# Patient Record
Sex: Female | Born: 1990 | Race: White | Hispanic: Yes | Marital: Single | Smoking: Never smoker
Health system: Southern US, Community
[De-identification: ages and names within clinical notes are randomized; demographics above are authoritative.]

## PROBLEM LIST (undated history)

## (undated) DIAGNOSIS — Z789 Other specified health status: Secondary | ICD-10-CM

## (undated) DIAGNOSIS — K8020 Calculus of gallbladder without cholecystitis without obstruction: Secondary | ICD-10-CM

## (undated) DIAGNOSIS — J189 Pneumonia, unspecified organism: Secondary | ICD-10-CM

## (undated) HISTORY — PX: NO PAST SURGERIES: SHX2092

---
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
## 2011-02-02 ENCOUNTER — Encounter: Payer: Self-pay | Admitting: Obstetrics and Gynecology

## 2011-02-02 ENCOUNTER — Ambulatory Visit: Payer: Self-pay | Admitting: Obstetrics and Gynecology

## 2011-02-02 MED ORDER — NORETHINDRONE 0.35 MG PO TABS: 1.0000 | ORAL_TABLET | Freq: Every day | ORAL | Status: DC

## 2011-02-02 NOTE — Patient Instructions (Signed)
Reviewed how to take OCPs, danger sign. Advised abd tightening exercises and encourage to continue breastfeeding

## 2011-02-02 NOTE — Progress Notes (Unsigned)
Kristy Martin is a 20 y.o. G1P1001 at 8 wks post NSVD female Chief Complaint  Patient presents with  . Postpartum Care  SUBJECTIVE: She has no postpartum concerns except mild diffuse abd discomfort since delivery.  She was induced at 39 weeks due to lung mass on the baby. Baby stayed in NICU for 4 days and is now doing well. Plan is to recheck at age 74-7 months and consider surgery if the mass is still present. She's breast-feeding.4 times a day and bottlefeeding about 4 ounces 36 hours. She graduated from high school and plans to stay home with the baby use taking care the baby and 2 other children during the day her mother helps with baby care.baby sleeps all-night. She denies depression. She had no perineal repair. Sexually active without problem. Walking but not doing any specific exercising. Desires OCPs .  Past Medical History  Diagnosis Date  . Asthma   . No pertinent past medical history    History   Social History  . Marital Status: Single    Spouse Name: N/A    Number of Children: N/A  . Years of Education: N/A   Social History Main Topics  . Smoking status: Never Smoker   . Smokeless tobacco: Never Used  . Alcohol Use: No  . Drug Use: No  . Sexually Active: Yes    Birth Control/ Protection: Inserts   Other Topics Concern  . None   Social History Narrative  . None   Review of Systems  Constitutional: Negative for fever and chills.  Gastrointestinal: Positive for abdominal pain. Negative for nausea and vomiting.  Genitourinary: Negative for dysuria.  Neurological: Negative for headaches.  Psychiatric/Behavioral: Negative for depression.   OBJECTIVE: Filed Vitals:   02/02/11 1524  BP: 118/67  Pulse: 87  Temp: 97.8 F (36.6 C)  Gen.: NAD Head: Normocephalic Neck: no thyromegaly Lungs: CTA bilateral Heart: R RR without murmur Abdomen soft no masses extensive stretch marks, 2 fingerbreadth diastasis Bimanual cervix firm long closed  Uterus normal  size shape and position  Adnexae:No tenderness or masses Extremities: NT calves, no edema  A/P  Normal 8 week postpartum exam. Rx Micronor. Diet and exercise reviewed.

## 2011-06-30 ENCOUNTER — Encounter (HOSPITAL_COMMUNITY): Payer: Self-pay | Admitting: Emergency Medicine

## 2011-06-30 ENCOUNTER — Emergency Department (HOSPITAL_COMMUNITY)
Admission: EM | Admit: 2011-06-30 | Discharge: 2011-06-30 | Disposition: A | Discharge status: Home / Self Care | Payer: Self-pay | Attending: Emergency Medicine | Admitting: Emergency Medicine

## 2011-06-30 DIAGNOSIS — A499 Bacterial infection, unspecified: Secondary | ICD-10-CM | POA: Insufficient documentation

## 2011-06-30 DIAGNOSIS — R42 Dizziness and giddiness: Secondary | ICD-10-CM | POA: Insufficient documentation

## 2011-06-30 DIAGNOSIS — B9689 Other specified bacterial agents as the cause of diseases classified elsewhere: Secondary | ICD-10-CM | POA: Insufficient documentation

## 2011-06-30 DIAGNOSIS — N760 Acute vaginitis: Secondary | ICD-10-CM | POA: Insufficient documentation

## 2011-06-30 DIAGNOSIS — R1032 Left lower quadrant pain: Secondary | ICD-10-CM | POA: Insufficient documentation

## 2011-06-30 DIAGNOSIS — R110 Nausea: Secondary | ICD-10-CM | POA: Insufficient documentation

## 2011-06-30 LAB — URINALYSIS, ROUTINE W REFLEX MICROSCOPIC
Glucose, UA: NEGATIVE mg/dL
Ketones, ur: NEGATIVE mg/dL
Nitrite: NEGATIVE
Protein, ur: NEGATIVE mg/dL
Urobilinogen, UA: 0.2 mg/dL (ref 0.0–1.0)

## 2011-06-30 LAB — WET PREP, GENITAL: Yeast Wet Prep HPF POC: NONE SEEN

## 2011-06-30 LAB — POCT I-STAT, CHEM 8
BUN: 8 mg/dL (ref 6–23)
Calcium, Ion: 1.3 mmol/L (ref 1.12–1.32)
TCO2: 28 mmol/L (ref 0–100)

## 2011-06-30 LAB — URINE MICROSCOPIC-ADD ON

## 2011-06-30 MED ORDER — METRONIDAZOLE 500 MG PO TABS: 500.0000 mg | ORAL_TABLET | Freq: Three times a day (TID) | ORAL | Status: AC

## 2011-06-30 MED ORDER — ONDANSETRON HCL 4 MG PO TABS: 4.0000 mg | ORAL_TABLET | Freq: Four times a day (QID) | ORAL | Status: AC

## 2011-06-30 NOTE — ED Notes (Signed)
Vital signs stable. 

## 2011-06-30 NOTE — ED Notes (Signed)
White d/c she states

## 2011-06-30 NOTE — ED Notes (Signed)
Patient here with complaints of nausea and vomiting and dizziness for two weeks, sts last menstrual cycle was 12/31 and she may be pregnant, she did take a test and was negative.

## 2011-06-30 NOTE — ED Notes (Signed)
Family at bedside. 

## 2011-06-30 NOTE — ED Notes (Signed)
N/v x 2 weeks left sided abd pain 12/31 lmp took home preg test it was neg

## 2011-06-30 NOTE — ED Notes (Signed)
Patient is resting comfortably. 

## 2011-06-30 NOTE — ED Notes (Signed)
Patient denies pain and is resting comfortably.  

## 2011-06-30 NOTE — ED Provider Notes (Signed)
History     CSN: [id number]  Arrival date & time 06/30/11  1131   First MD Initiated Contact with Patient 06/30/11 1203      Chief Complaint  Patient presents with  . Nausea    (Consider location/radiation/quality/duration/timing/severity/associated sxs/prior treatment) HPI  Patient relates for the past 2 weeks she's had nausea, states she feels dizzy, and has decreased appetite. She is noted for the last week she's had some left lower cautery pain off and on that lasted about 5 minutes. She states she feels dizzy. Patient is G1 P1 Ab0. She has her infant with her who appears to be about 51 months old. She relates her last normal period was December 31 and was little bit wider than usual. She denies using any birth control. She denies any new sexual contact.. patient states she felt similar to when she had anemia in the past.  PCP none  Past Medical History  Diagnosis Date  . Anemia   . No pertinent past medical history     Past Surgical History  Procedure Date  . No past surgeries     No family history on file.  History  Substance Use Topics  . Smoking status: Never Smoker   . Smokeless tobacco: Never Used  . Alcohol Use: No   lives with spouse  OB History    Grav Para Term Preterm Abortions TAB SAB Ect Mult Living   1 1 1  0 0 0 0 0 0 1      Review of Systems  All other systems reviewed and are negative.    Allergies  Review of patient's allergies indicates no known allergies.  Home Medications   Current Outpatient Rx  Name Route Sig Dispense Refill  . DOCUSATE SODIUM 100 MG PO CAPS Oral Take 100 mg by mouth 2 (two) times daily.      BP 114/52  Pulse 72  Temp(Src) 98.2 F (36.8 C) (Oral)  Resp 20  SpO2 98%  Breastfeeding? Yes  Vital signs normal    Physical Exam  Constitutional: She is oriented to person, place, and time. She appears well-developed and well-nourished.  Non-toxic appearance. She does not appear ill. No distress.  HENT:  Head:  Normocephalic and atraumatic.  Right Ear: External ear normal.  Left Ear: External ear normal.  Nose: Nose normal. No mucosal edema or rhinorrhea.  Mouth/Throat: Mucous membranes are normal. No dental abscesses or uvula swelling.       Mucous membranes dry  Eyes: Conjunctivae and EOM are normal. Pupils are equal, round, and reactive to light.       Sclera are not pale  Neck: Normal range of motion and full passive range of motion without pain. Neck supple.  Cardiovascular: Normal rate, regular rhythm and normal heart sounds.  Exam reveals no gallop and no friction rub.   No murmur heard. Pulmonary/Chest: Effort normal and breath sounds normal. No respiratory distress. She has no wheezes. She has no rhonchi. She has no rales. She exhibits no tenderness and no crepitus.  Abdominal: Soft. Normal appearance and bowel sounds are normal. She exhibits no distension. There is tenderness. There is no rebound and no guarding.       Patient has mild tenderness in her left lower quadrant without guarding or rebound  Genitourinary:       Patient has normal external genitalia. She is noted to have moderate amount of yellow discharge. She has minimal discomfort to palpation of her uterus which is normal size, or  her adnexa which are not enlarged. She relates to pelvic exam does not reproduce her pain.  Musculoskeletal: Normal range of motion. She exhibits no edema and no tenderness.       Moves all extremities well.   Neurological: She is alert and oriented to person, place, and time. She has normal strength. No cranial nerve deficit.  Skin: Skin is warm, dry and intact. No rash noted. No erythema. No pallor.  Psychiatric: She has a normal mood and affect. Her speech is normal and behavior is normal. Her mood appears not anxious.    ED Course  Procedures (including critical care time)  Pt refused and nausea or pain medications.   Results for orders placed during the hospital encounter of 06/30/11    URINALYSIS, ROUTINE W REFLEX MICROSCOPIC      Component Value Range   Color, Urine YELLOW  YELLOW    APPearance CLOUDY (*) CLEAR    Specific Gravity, Urine 1.021  1.005 - 1.030    pH 5.5  5.0 - 8.0    Glucose, UA NEGATIVE  NEGATIVE (mg/dL)   Hgb urine dipstick NEGATIVE  NEGATIVE    Bilirubin Urine NEGATIVE  NEGATIVE    Ketones, ur NEGATIVE  NEGATIVE (mg/dL)   Protein, ur NEGATIVE  NEGATIVE (mg/dL)   Urobilinogen, UA 0.2  0.0 - 1.0 (mg/dL)   Nitrite NEGATIVE  NEGATIVE    Leukocytes, UA MODERATE (*) NEGATIVE   POCT PREGNANCY, URINE      Component Value Range   Preg Test, Ur NEGATIVE  NEGATIVE   WET PREP, GENITAL      Component Value Range   Yeast Wet Prep HPF POC NONE SEEN  NONE SEEN    Trich, Wet Prep NONE SEEN  NONE SEEN    Clue Cells Wet Prep HPF POC MANY (*) NONE SEEN    WBC, Wet Prep HPF POC TOO NUMEROUS TO COUNT (*) NONE SEEN   URINE MICROSCOPIC-ADD ON      Component Value Range   Squamous Epithelial / LPF MANY (*) RARE    WBC, UA 3-6  <3 (WBC/hpf)   RBC / HPF 0-2  <3 (RBC/hpf)   Bacteria, UA MANY (*) RARE   POCT I-STAT, CHEM 8      Component Value Range   Sodium 142  135 - 145 (mEq/L)   Potassium 3.9  3.5 - 5.1 (mEq/L)   Chloride 102  96 - 112 (mEq/L)   BUN 8  6 - 23 (mg/dL)   Creatinine, Ser 8.29  0.50 - 1.10 (mg/dL)   Glucose, Bld 80  70 - 99 (mg/dL)   Calcium, Ion 5.62  1.30 - 1.32 (mmol/L)   TCO2 28  0 - 100 (mmol/L)   Hemoglobin 13.9  12.0 - 15.0 (g/dL)   HCT 86.5  78.4 - 69.6 (%)   Laboratory interpretation all normal except bacterial vaginosis   Diagnoses that have been ruled out:  None  Diagnoses that are still under consideration:  None  Final diagnoses:  BV (bacterial vaginosis)  Nausea   New Prescriptions   METRONIDAZOLE (FLAGYL) 500 MG TABLET    Take 1 tablet (500 mg total) by mouth 3 (three) times daily.   ONDANSETRON (ZOFRAN) 4 MG TABLET    Take 1 tablet (4 mg total) by mouth every 6 (six) hours.   Plan discharge Devoria Albe, MD,  FACEP    MDM          Ward Givens, MD 06/30/11 [phone]

## 2014-02-21 ENCOUNTER — Encounter (HOSPITAL_COMMUNITY): Payer: Self-pay | Admitting: Emergency Medicine

## 2014-02-21 ENCOUNTER — Emergency Department (HOSPITAL_COMMUNITY): Payer: Self-pay

## 2014-02-21 ENCOUNTER — Emergency Department (HOSPITAL_COMMUNITY)
Admission: EM | Admit: 2014-02-21 | Discharge: 2014-02-21 | Disposition: A | Discharge status: Home / Self Care | Payer: Self-pay | Attending: Emergency Medicine | Admitting: Emergency Medicine

## 2014-02-21 DIAGNOSIS — K8050 Calculus of bile duct without cholangitis or cholecystitis without obstruction: Secondary | ICD-10-CM | POA: Insufficient documentation

## 2014-02-21 DIAGNOSIS — K8020 Calculus of gallbladder without cholecystitis without obstruction: Secondary | ICD-10-CM | POA: Insufficient documentation

## 2014-02-21 DIAGNOSIS — Z3202 Encounter for pregnancy test, result negative: Secondary | ICD-10-CM | POA: Insufficient documentation

## 2014-02-21 DIAGNOSIS — J45909 Unspecified asthma, uncomplicated: Secondary | ICD-10-CM | POA: Insufficient documentation

## 2014-02-21 DIAGNOSIS — K8070 Calculus of gallbladder and bile duct without cholecystitis without obstruction: Secondary | ICD-10-CM

## 2014-02-21 DIAGNOSIS — R1084 Generalized abdominal pain: Secondary | ICD-10-CM | POA: Insufficient documentation

## 2014-02-21 LAB — CBC WITH DIFFERENTIAL/PLATELET
BASOS PCT: 1 % (ref 0–1)
Basophils Absolute: 0.1 10*3/uL (ref 0.0–0.1)
Eosinophils Absolute: 0.2 10*3/uL (ref 0.0–0.7)
Eosinophils Relative: 2 % (ref 0–5)
HEMATOCRIT: 39.2 % (ref 36.0–46.0)
HEMOGLOBIN: 13.2 g/dL (ref 12.0–15.0)
LYMPHS ABS: 3.8 10*3/uL (ref 0.7–4.0)
Lymphocytes Relative: 45 % (ref 12–46)
MCH: 29 pg (ref 26.0–34.0)
MCHC: 33.7 g/dL (ref 30.0–36.0)
MCV: 86.2 fL (ref 78.0–100.0)
MONO ABS: 0.6 10*3/uL (ref 0.1–1.0)
MONOS PCT: 7 % (ref 3–12)
NEUTROS ABS: 3.9 10*3/uL (ref 1.7–7.7)
NEUTROS PCT: 45 % (ref 43–77)
Platelets: 303 10*3/uL (ref 150–400)
RBC: 4.55 MIL/uL (ref 3.87–5.11)
RDW: 13.3 % (ref 11.5–15.5)
WBC: 8.5 10*3/uL (ref 4.0–10.5)

## 2014-02-21 LAB — COMPREHENSIVE METABOLIC PANEL
ALK PHOS: 104 U/L (ref 39–117)
ALT: 14 U/L (ref 0–35)
ANION GAP: 12 (ref 5–15)
AST: 20 U/L (ref 0–37)
Albumin: 3.8 g/dL (ref 3.5–5.2)
BILIRUBIN TOTAL: 0.2 mg/dL — AB (ref 0.3–1.2)
BUN: 16 mg/dL (ref 6–23)
CHLORIDE: 100 meq/L (ref 96–112)
CO2: 27 meq/L (ref 19–32)
CREATININE: 0.58 mg/dL (ref 0.50–1.10)
Calcium: 9.4 mg/dL (ref 8.4–10.5)
GLUCOSE: 88 mg/dL (ref 70–99)
POTASSIUM: 3.8 meq/L (ref 3.7–5.3)
Sodium: 139 mEq/L (ref 137–147)
Total Protein: 7.2 g/dL (ref 6.0–8.3)

## 2014-02-21 LAB — URINALYSIS, ROUTINE W REFLEX MICROSCOPIC
BILIRUBIN URINE: NEGATIVE
Glucose, UA: NEGATIVE mg/dL
HGB URINE DIPSTICK: NEGATIVE
KETONES UR: NEGATIVE mg/dL
Leukocytes, UA: NEGATIVE
NITRITE: NEGATIVE
Protein, ur: NEGATIVE mg/dL
SPECIFIC GRAVITY, URINE: 1.008 (ref 1.005–1.030)
UROBILINOGEN UA: 0.2 mg/dL (ref 0.0–1.0)
pH: 7.5 (ref 5.0–8.0)

## 2014-02-21 LAB — LIPASE, BLOOD: Lipase: 24 U/L (ref 11–59)

## 2014-02-21 LAB — POC URINE PREG, ED: PREG TEST UR: NEGATIVE

## 2014-02-21 MED ORDER — HYDROCODONE-ACETAMINOPHEN 5-325 MG PO TABS: 2.0000 | ORAL_TABLET | ORAL | Status: DC | PRN

## 2014-02-21 NOTE — ED Notes (Signed)
Pt.is aware if needing an urine specimen

## 2014-02-21 NOTE — ED Notes (Signed)
Patient arrives with complaint of abdominal pain which started last night. States that pain was initially mild, but became worse until this morning when she could not longer stand it. Points to Kristy Martin. Denies eating any suspicious foods. Endorses nausea with emesis x1 this morning. Denies urinary symptoms and fevers.

## 2014-02-21 NOTE — ED Provider Notes (Signed)
CSN: [id number]     Arrival date & time 02/21/14  8295 History   First MD Initiated Contact with Patient 02/21/14 [phone]     Chief Complaint  Patient presents with  . Abdominal Pain     (Consider location/radiation/quality/duration/timing/severity/associated sxs/prior Treatment) Patient is a 23 y.o. female presenting with abdominal pain. The history is provided by the patient. No language interpreter was used.  Abdominal Pain Pain location:  Generalized Pain quality: aching and sharp   Pain radiates to:  RUQ Pain severity:  Moderate Onset quality:  Sudden Timing:  Constant Progression:  Worsening Chronicity:  New Relieved by:  Nothing Worsened by:  Nothing tried Ineffective treatments:  None tried Associated symptoms: no dysuria and no shortness of breath   Risk factors: no alcohol abuse and has not had multiple surgeries     Past Medical History  Diagnosis Date  . Asthma   . No pertinent past medical history    Past Surgical History  Procedure Laterality Date  . No past surgeries     No family history on file. History  Substance Use Topics  . Smoking status: Never Smoker   . Smokeless tobacco: Never Used  . Alcohol Use: No   OB History   Grav Para Term Preterm Abortions TAB SAB Ect Mult Living   0 0 0 0 0 0 1     Review of Systems  Respiratory: Negative for shortness of breath.   Gastrointestinal: Positive for abdominal pain.  Genitourinary: Negative for dysuria.  All other systems reviewed and are negative.     Allergies  Review of patient's allergies indicates no known allergies.  Home Medications   Prior to Admission medications   Not on File   BP 115/54  Pulse 63  Temp(Src) 97.7 F (36.5 C) (Oral)  Resp 12  Ht  (1.575 m)  Wt 150 lb (68.04 kg)  BMI 27.43 kg/m2  SpO2 99% Physical Exam  Nursing note and vitals reviewed. Constitutional: She is oriented to person, place, and time. She appears well-developed and well-nourished.   HENT:  Head: Normocephalic and atraumatic.  Right Ear: External ear normal.  Left Ear: External ear normal.  Nose: Nose normal.  Mouth/Throat: Oropharynx is clear and moist.  Eyes: Conjunctivae and EOM are normal. Pupils are equal, round, and reactive to light.  Neck: Normal range of motion.  Cardiovascular: Normal rate and normal heart sounds.   Pulmonary/Chest: Effort normal.  Abdominal: Soft. She exhibits no distension. There is tenderness.  Musculoskeletal: Normal range of motion.  Neurological: She is alert and oriented to person, place, and time.  Skin: Skin is warm.  Psychiatric: She has a normal mood and affect.    ED Course  Procedures (including critical care time) Labs Review Labs Reviewed  COMPREHENSIVE METABOLIC PANEL - Abnormal; Notable for the following:    Total Bilirubin 0.2 (*)    All other components within normal limits  CBC WITH DIFFERENTIAL  LIPASE, BLOOD  URINALYSIS, ROUTINE W REFLEX MICROSCOPIC  POC URINE PREG, ED    Imaging Review No results found.   EKG Interpretation None      MDM   Final diagnoses:  Calculus of gallbladder and bile duct without cholecystitis or obstruction   [facility] [place] [facility] referral  Hydrocodone    Elson Areas, PA-C 02/21/14 1119

## 2014-02-21 NOTE — ED Notes (Signed)
Urine sepcimen obtained, clear and yellow.

## 2014-02-21 NOTE — Discharge Instructions (Signed)
Biliary Colic  °Biliary colic is a steady or irregular pain in the upper abdomen. It is usually under the right side of the rib cage. It happens when gallstones interfere with the normal flow of bile from the gallbladder. Bile is a liquid that helps to digest fats. Bile is made in the liver and stored in the gallbladder. When you eat a meal, bile passes from the gallbladder through the cystic duct and the common bile duct into the small intestine. There, it mixes with partially digested food. If a gallstone blocks either of these ducts, the normal flow of bile is blocked. The muscle cells in the bile duct contract forcefully to try to move the stone. This causes the pain of biliary colic.  °SYMPTOMS  °· A person with biliary colic usually complains of pain in the upper abdomen. This pain can be: °¨ In the center of the upper abdomen just below the breastbone. °¨ In the upper-right part of the abdomen, near the gallbladder and liver. °¨ Spread back toward the right shoulder blade. °· Nausea and vomiting. °· The pain usually occurs after eating. °· Biliary colic is usually triggered by the digestive system's demand for bile. The demand for bile is high after fatty meals. Symptoms can also occur when a person who has been fasting suddenly eats a very large meal. Most episodes of biliary colic pass after 1 to 5 hours. After the most intense pain passes, your abdomen may continue to ache mildly for about 24 hours. °DIAGNOSIS  °After you describe your symptoms, your caregiver will perform a physical exam. He or she will pay attention to the upper right portion of your belly (abdomen). This is the area of your liver and gallbladder. An ultrasound will help your caregiver look for gallstones. Specialized scans of the gallbladder may also be done. Blood tests may be done, especially if you have fever or if your pain persists. °PREVENTION  °Biliary colic can be prevented by controlling the risk factors for gallstones. Some of  these risk factors, such as heredity, increasing age, and pregnancy are a normal part of life. Obesity and a high-fat diet are risk factors you can change through a healthy lifestyle. Women going through menopause who take hormone replacement therapy (estrogen) are also more likely to develop biliary colic. °TREATMENT  °· Pain medication may be prescribed. °· You may be encouraged to eat a fat-free diet. °· If the first episode of biliary colic is severe, or episodes of colic keep retuning, surgery to remove the gallbladder (cholecystectomy) is usually recommended. This procedure can be done through small incisions using an instrument called a laparoscope. The procedure often requires a brief stay in the hospital. Some people can leave the hospital the same day. It is the most widely used treatment in people troubled by painful gallstones. It is effective and safe, with no complications in more than 90% of cases. °· If surgery cannot be done, medication that dissolves gallstones may be used. This medication is expensive and can take months or years to work. Only small stones will dissolve. °· Rarely, medication to dissolve gallstones is combined with a procedure called shock-wave lithotripsy. This procedure uses carefully aimed shock waves to break up gallstones. In many people treated with this procedure, gallstones form again within a few years. °PROGNOSIS  °If gallstones block your cystic duct or common bile duct, you are at risk for repeated episodes of biliary colic. There is also a 25% chance that you will develop   a gallbladder infection(acute cholecystitis), or some other complication of gallstones within 10 to 20 years. If you have surgery, schedule it at a time that is convenient for you and at a time when you are not sick. °HOME CARE INSTRUCTIONS  °· Drink plenty of clear fluids. °· Avoid fatty, greasy or fried foods, or any foods that make your pain worse. °· Take medications as directed. °SEEK MEDICAL  CARE IF:  °· You develop a fever over 100.5° F (38.1° C). °· Your pain gets worse over time. °· You develop nausea that prevents you from eating and drinking. °· You develop vomiting. °SEEK IMMEDIATE MEDICAL CARE IF:  °· You have continuous or severe belly (abdominal) pain which is not relieved with medications. °· You develop nausea and vomiting which is not relieved with medications. °· You have symptoms of biliary colic and you suddenly develop a fever and shaking chills. This may signal cholecystitis. Call your caregiver immediately. °· You develop a yellow color to your skin or the white part of your eyes (jaundice). °Document Released: 10/16/2005 Document Revised: 08/07/2011 Document Reviewed: 12/26/2007 °ExitCare® Patient Information ©2015 [employer]. This information is not intended to replace advice given to you by your health care provider. Make sure you discuss any questions you have with your health care provider. ° °

## 2014-02-21 NOTE — ED Notes (Signed)
Patient declines wheelchair at discharge.  Patient escorted to lobby by RN.  

## 2014-02-21 NOTE — ED Provider Notes (Signed)
Medical screening examination/treatment/procedure(s) were performed by non-physician practitioner and as supervising physician I was immediately available for consultation/collaboration.   EKG Interpretation None        Lakysha Kossman H Lisette Mancebo, MD 02/21/14 1126 

## 2014-03-30 ENCOUNTER — Encounter (HOSPITAL_COMMUNITY): Payer: Self-pay | Admitting: Emergency Medicine

## 2014-05-18 ENCOUNTER — Emergency Department (HOSPITAL_COMMUNITY): Payer: Self-pay

## 2014-05-18 ENCOUNTER — Encounter (HOSPITAL_COMMUNITY): Payer: Self-pay | Admitting: *Deleted

## 2014-05-18 ENCOUNTER — Emergency Department (HOSPITAL_COMMUNITY)
Admission: EM | Admit: 2014-05-18 | Discharge: 2014-05-18 | Disposition: A | Discharge status: Home / Self Care | Payer: Self-pay | Attending: Emergency Medicine | Admitting: Emergency Medicine

## 2014-05-18 DIAGNOSIS — J45909 Unspecified asthma, uncomplicated: Secondary | ICD-10-CM | POA: Insufficient documentation

## 2014-05-18 DIAGNOSIS — K8020 Calculus of gallbladder without cholecystitis without obstruction: Secondary | ICD-10-CM | POA: Insufficient documentation

## 2014-05-18 DIAGNOSIS — R1011 Right upper quadrant pain: Secondary | ICD-10-CM

## 2014-05-18 LAB — URINALYSIS, ROUTINE W REFLEX MICROSCOPIC
Bilirubin Urine: NEGATIVE
Glucose, UA: NEGATIVE mg/dL
Hgb urine dipstick: NEGATIVE
KETONES UR: NEGATIVE mg/dL
LEUKOCYTES UA: NEGATIVE
NITRITE: NEGATIVE
PROTEIN: NEGATIVE mg/dL
Specific Gravity, Urine: 1.009 (ref 1.005–1.030)
UROBILINOGEN UA: 0.2 mg/dL (ref 0.0–1.0)
pH: 6.5 (ref 5.0–8.0)

## 2014-05-18 LAB — COMPREHENSIVE METABOLIC PANEL
ALT: 16 U/L (ref 0–35)
AST: 21 U/L (ref 0–37)
Albumin: 3.9 g/dL (ref 3.5–5.2)
Alkaline Phosphatase: 105 U/L (ref 39–117)
Anion gap: 11 (ref 5–15)
BUN: 10 mg/dL (ref 6–23)
CALCIUM: 9.7 mg/dL (ref 8.4–10.5)
CO2: 25 meq/L (ref 19–32)
CREATININE: 0.56 mg/dL (ref 0.50–1.10)
Chloride: 102 mEq/L (ref 96–112)
GLUCOSE: 92 mg/dL (ref 70–99)
Potassium: 4.5 mEq/L (ref 3.7–5.3)
Sodium: 138 mEq/L (ref 137–147)
TOTAL PROTEIN: 7.5 g/dL (ref 6.0–8.3)
Total Bilirubin: <0.2 mg/dL — ABNORMAL LOW (ref 0.3–1.2)

## 2014-05-18 LAB — I-STAT BETA HCG BLOOD, ED (MC, WL, AP ONLY): I-stat hCG, quantitative: <5 m[IU]/mL (ref <5)

## 2014-05-18 LAB — CBC WITH DIFFERENTIAL/PLATELET
BASOS ABS: 0 10*3/uL (ref 0.0–0.1)
Basophils Relative: 0 % (ref 0–1)
EOS ABS: 0.2 10*3/uL (ref 0.0–0.7)
EOS PCT: 2 % (ref 0–5)
HEMATOCRIT: 40.4 % (ref 36.0–46.0)
Hemoglobin: 13.4 g/dL (ref 12.0–15.0)
LYMPHS ABS: 4.1 10*3/uL — AB (ref 0.7–4.0)
LYMPHS PCT: 43 % (ref 12–46)
MCH: 29.2 pg (ref 26.0–34.0)
MCHC: 33.2 g/dL (ref 30.0–36.0)
MCV: 88 fL (ref 78.0–100.0)
MONO ABS: 0.7 10*3/uL (ref 0.1–1.0)
Monocytes Relative: 7 % (ref 3–12)
Neutro Abs: 4.6 10*3/uL (ref 1.7–7.7)
Neutrophils Relative %: 48 % (ref 43–77)
PLATELETS: 320 10*3/uL (ref 150–400)
RBC: 4.59 MIL/uL (ref 3.87–5.11)
RDW: 13.3 % (ref 11.5–15.5)
WBC: 9.5 10*3/uL (ref 4.0–10.5)

## 2014-05-18 LAB — LIPASE, BLOOD: LIPASE: 23 U/L (ref 11–59)

## 2014-05-18 MED ORDER — ONDANSETRON HCL 4 MG/2ML IJ SOLN
4.0000 mg | Freq: Once | INTRAMUSCULAR | Status: AC
  Administered 2014-05-18: 4 mg via INTRAVENOUS
  Filled 2014-05-18: qty 2

## 2014-05-18 MED ORDER — SODIUM CHLORIDE 0.9 % IV BOLUS (SEPSIS)
1000.0000 mL | Freq: Once | INTRAVENOUS | Status: AC
Start: 2014-05-18 — End: 2014-05-18
  Administered 2014-05-18: 1000 mL via INTRAVENOUS

## 2014-05-18 MED ORDER — MORPHINE SULFATE 4 MG/ML IJ SOLN
4.0000 mg | Freq: Once | INTRAMUSCULAR | Status: AC
  Administered 2014-05-18: 4 mg via INTRAVENOUS
  Filled 2014-05-18: qty 1

## 2014-05-18 NOTE — Discharge Instructions (Signed)
Cholelithiasis  Ms. Kristy Martin, you were seen today for abdominal pain. Ultrasound shows a gallstone, do not eat any greasy foods as this will make her symptoms worse. Follow-up with a primary care physician within 3 days for continued treatment. If symptoms worsen come back to emergency department for repeat evaluation. Thank you. Cholelithiasis (also called gallstones) is a form of gallbladder disease. The gallbladder is a small organ that helps you digest fats. Symptoms of gallstones are:  Feeling sick to your stomach (nausea).  Throwing up (vomiting).  Belly pain.  Yellowing of the skin (jaundice).  Sudden pain. You may feel the pain for minutes to hours.  Fever.  Pain to the touch. HOME CARE  Only take medicines as told by your doctor.  Eat a low-fat diet until you see your doctor again. Eating fat can result in pain.  Follow up with your doctor as told. Attacks usually happen time after time. Surgery is usually needed for permanent treatment. GET HELP RIGHT AWAY IF:   Your pain gets worse.  Your pain is not helped by medicines.  You have a fever and lasting symptoms for more than 2-3 days.  You have a fever and your symptoms suddenly get worse.  You keep feeling sick to your stomach and throwing up. MAKE SURE YOU:   Understand these instructions.  Will watch your condition.  Will get help right away if you are not doing well or get worse. Document Released: 11/01/2007 Document Revised: 01/15/2013 Document Reviewed: 11/06/2012 [facility]Exit[facility] Patient Information 2015 [place], [place]. This information is not intended to replace advice given to you by your health care provider. Make sure you discuss any questions you have with your health care provider.

## 2014-05-18 NOTE — ED Notes (Signed)
thge pt ia c/o rt abd pain for 2-3 days.  She repiorts that she has gallstones.  lmp 2 months ago.  She has irregular periods

## 2014-05-18 NOTE — ED Provider Notes (Signed)
CSN: [id number]     Arrival date & time 05/18/14  0100 History  This chart was scribed for Kristy Martin Nickisha Hum, MD by Karle PlumberJennifer Tensley, ED Scribe. This patient was seen in room A07C/A07C and the patient's care was started at 1:36 AM.  Chief Complaint  Patient presents with  . Abdominal Pain   The history is provided by the patient. No language interpreter was used.    HPI Comments:  Link SnufferBarbara F Garcia Martin is a 23 y.o. female with PMH of gall stones (diagnosed approximately 2 months ago) who presents to the Emergency Department complaining of cramping right-sided abdominal pain that radiates around her abdomen that began two days ago. She reports associated nausea. Pt states she has taken Aleve (last dose 2 hours ago) with no significant relief of the pain. Patient states her symptoms are worse when she eats greasy foods, and her pain is consistent with her history of cholelithiasis. Denies vomiting, diarrhea, fever, chills or lower extremity pain. PMH of asthma.  Past Medical History  Diagnosis Date  . Asthma   . No pertinent past medical history    Past Surgical History  Procedure Laterality Date  . No past surgeries     No family history on file. History  Substance Use Topics  . Smoking status: Never Smoker   . Smokeless tobacco: Never Used  . Alcohol Use: No   OB History    Gravida Para Term Preterm AB TAB SAB Ectopic Multiple Living   1 1 1  0 0 0 0 0 0 1     Review of Systems  Constitutional: Negative for fever and chills.  Gastrointestinal: Positive for nausea and abdominal pain. Negative for vomiting and diarrhea.  All other systems reviewed and are negative.   Allergies  Review of patient's allergies indicates no known allergies.  Home Medications   Prior to Admission medications   Medication Sig Start Date End Date Taking? Authorizing Provider  HYDROcodone-acetaminophen (NORCO/VICODIN) 5-325 MG per tablet Take 2 tablets by mouth every 4 (four) hours as needed for  moderate pain or severe pain. 02/21/14  Yes Elson AreasLeslie K Sofia, PA-C   Triage Vitals: BP 115/62 mmHg  Pulse 60  Temp(Src) 97.3 F (36.3 C) (Oral)  Resp 16  SpO2 99%  LMP 03/18/2014 Physical Exam  Constitutional: She is oriented to person, place, and time. She appears well-developed and well-nourished. No distress.  HENT:  Head: Normocephalic and atraumatic.  Eyes: Conjunctivae and EOM are normal. Pupils are equal, round, and reactive to light. No scleral icterus.  Neck: Normal range of motion. Neck supple. No JVD present. No tracheal deviation present. No thyromegaly present.  Cardiovascular: Normal rate, regular rhythm and normal heart sounds.  Exam reveals no gallop and no friction rub.   No murmur heard. Pulmonary/Chest: Effort normal and breath sounds normal. No respiratory distress. She has no wheezes. She exhibits no tenderness.  Abdominal: Soft. Bowel sounds are normal. She exhibits no distension and no mass. There is tenderness (periumbilical and suprapubic). There is no rebound and no guarding.  Negative Murphy's sign  Musculoskeletal: Normal range of motion. She exhibits no edema or tenderness.  Lymphadenopathy:    She has no cervical adenopathy.  Neurological: She is alert and oriented to person, place, and time. No cranial nerve deficit. She exhibits normal muscle tone.  Skin: Skin is warm and dry. No rash noted. No erythema. No pallor.  Nursing note and vitals reviewed.   ED Course  Procedures (including critical care time) DIAGNOSTIC STUDIES:  Oxygen Saturation is 99% on RA, normal by my interpretation.   COORDINATION OF CARE: 1:40 AM- Will order pain and nausea medication, lab work and ultrasound abdomen. Pt verbalizes understanding and agrees to plan.  Medications  sodium chloride 0.9 % bolus 1,000 mL (1,000 mLs Intravenous New Bag/Given 05/18/14 0147)  morphine 4 MG/ML injection 4 mg (4 mg Intravenous Given 05/18/14 0153)  ondansetron (ZOFRAN) injection 4 mg (4 mg  Intravenous Given 05/18/14 0151)    Labs Review Labs Reviewed  CBC WITH DIFFERENTIAL - Abnormal; Notable for the following:    Lymphs Abs 4.1 (*)    All other components within normal limits  COMPREHENSIVE METABOLIC PANEL - Abnormal; Notable for the following:    Total Bilirubin <0.2 (*)    All other components within normal limits  URINE CULTURE  LIPASE, BLOOD  URINALYSIS, ROUTINE W REFLEX MICROSCOPIC    Imaging Review [place] Abdomen Complete  05/18/2014   CLINICAL DATA:  Acute onset of right upper quadrant abdominal pain. Initial encounter.  EXAM: ULTRASOUND ABDOMEN COMPLETE  COMPARISON:  None.  FINDINGS: Gallbladder: A 1.0 cm stone is seen lodged at the neck of the gallbladder. No gallbladder wall thickening or pericholecystic fluid is seen. No ultrasonographic Murphy's sign is elicited.  Common bile duct: Diameter: 0.5 cm, within normal limits in caliber.  Liver: No focal lesion identified. Within normal limits in parenchymal echogenicity.  IVC: No abnormality visualized.  Pancreas: Not visualized due to overlying bowel gas.  Spleen: Size and appearance within normal limits.  Right Kidney: Length: 9.3 cm. Echogenicity within normal limits. No mass or hydronephrosis visualized.  Left Kidney: Length: 10.5 cm. Echogenicity within normal limits. No mass or hydronephrosis visualized.  Abdominal aorta: No aneurysm visualized.  Other findings: None.  IMPRESSION: Single stone seen lodged at the neck of the gallbladder. Gallbladder otherwise unremarkable in appearance. No definite evidence for obstruction or cholecystitis. No ultrasonographic Murphy's sign elicited.   Electronically Signed   By: Roanna RaiderJeffery  Chang M.D.   On: 05/18/2014 02:49     EKG Interpretation None      MDM   Final diagnoses:  RUQ pain    Patient since emergency department out of concern for abdominal pain. It is in the right upper quadrant she states is consistent with her cholelithiasis. Ultrasound shows a gallstone in the  neck of the gallbladder, no cholecystitis associated. Her pain is controlled after morphine and Zofran. Patient was educated again on diet control, she will be advised to follow-up with a primary care physician, her vital signs remain within her normal limits and she is safe for discharge.  I personally performed the services described in this documentation, which was scribed in my presence. The recorded information has been reviewed and is accurate.    Kristy Martin Ariyon Gerstenberger, MD 05/18/14 [phone]

## 2014-05-19 LAB — URINE CULTURE
Colony Count: NO GROWTH
Culture: NO GROWTH

## 2014-06-15 ENCOUNTER — Encounter (HOSPITAL_COMMUNITY): Payer: Self-pay | Admitting: Emergency Medicine

## 2014-06-15 ENCOUNTER — Emergency Department (HOSPITAL_COMMUNITY)
Admission: EM | Admit: 2014-06-15 | Discharge: 2014-06-15 | Disposition: A | Discharge status: Home / Self Care | Payer: Self-pay | Attending: Emergency Medicine | Admitting: Emergency Medicine

## 2014-06-15 DIAGNOSIS — N72 Inflammatory disease of cervix uteri: Secondary | ICD-10-CM | POA: Insufficient documentation

## 2014-06-15 DIAGNOSIS — Z3202 Encounter for pregnancy test, result negative: Secondary | ICD-10-CM | POA: Insufficient documentation

## 2014-06-15 DIAGNOSIS — J45909 Unspecified asthma, uncomplicated: Secondary | ICD-10-CM | POA: Insufficient documentation

## 2014-06-15 DIAGNOSIS — Z8719 Personal history of other diseases of the digestive system: Secondary | ICD-10-CM | POA: Insufficient documentation

## 2014-06-15 HISTORY — DX: Calculus of gallbladder without cholecystitis without obstruction: K80.20

## 2014-06-15 LAB — COMPREHENSIVE METABOLIC PANEL
ALT: 18 U/L (ref 0–35)
AST: 23 U/L (ref 0–37)
Albumin: 3.6 g/dL (ref 3.5–5.2)
Alkaline Phosphatase: 94 U/L (ref 39–117)
Anion gap: 10 (ref 5–15)
BUN: 9 mg/dL (ref 6–23)
CALCIUM: 9.2 mg/dL (ref 8.4–10.5)
CHLORIDE: 104 meq/L (ref 96–112)
CO2: 22 mmol/L (ref 19–32)
Creatinine, Ser: 0.64 mg/dL (ref 0.50–1.10)
GFR calc Af Amer: >90 mL/min (ref >90)
GFR calc non Af Amer: >90 mL/min (ref >90)
GLUCOSE: 90 mg/dL (ref 70–99)
POTASSIUM: 3.9 mmol/L (ref 3.5–5.1)
SODIUM: 136 mmol/L (ref 135–145)
Total Bilirubin: 0.3 mg/dL (ref 0.3–1.2)
Total Protein: 7 g/dL (ref 6.0–8.3)

## 2014-06-15 LAB — URINALYSIS, ROUTINE W REFLEX MICROSCOPIC
Bilirubin Urine: NEGATIVE
Glucose, UA: NEGATIVE mg/dL
Hgb urine dipstick: NEGATIVE
KETONES UR: NEGATIVE mg/dL
LEUKOCYTES UA: NEGATIVE
Nitrite: NEGATIVE
PROTEIN: NEGATIVE mg/dL
SPECIFIC GRAVITY, URINE: 1.023 (ref 1.005–1.030)
Urobilinogen, UA: 1 mg/dL (ref 0.0–1.0)
pH: 6.5 (ref 5.0–8.0)

## 2014-06-15 LAB — CBC WITH DIFFERENTIAL/PLATELET
Basophils Absolute: 0 10*3/uL (ref 0.0–0.1)
Basophils Relative: 0 % (ref 0–1)
EOS ABS: 0.1 10*3/uL (ref 0.0–0.7)
Eosinophils Relative: 1 % (ref 0–5)
HCT: 39.7 % (ref 36.0–46.0)
Hemoglobin: 13.2 g/dL (ref 12.0–15.0)
LYMPHS ABS: 4.3 10*3/uL — AB (ref 0.7–4.0)
Lymphocytes Relative: 42 % (ref 12–46)
MCH: 29.3 pg (ref 26.0–34.0)
MCHC: 33.2 g/dL (ref 30.0–36.0)
MCV: 88.2 fL (ref 78.0–100.0)
MONO ABS: 0.7 10*3/uL (ref 0.1–1.0)
Monocytes Relative: 7 % (ref 3–12)
Neutro Abs: 4.9 10*3/uL (ref 1.7–7.7)
Neutrophils Relative %: 50 % (ref 43–77)
Platelets: 338 10*3/uL (ref 150–400)
RBC: 4.5 MIL/uL (ref 3.87–5.11)
RDW: 13.5 % (ref 11.5–15.5)
WBC: 10.1 10*3/uL (ref 4.0–10.5)

## 2014-06-15 LAB — GC/CHLAMYDIA PROBE AMP (~~LOC~~) NOT AT ARMC
CHLAMYDIA, DNA PROBE: NEGATIVE
Neisseria Gonorrhea: NEGATIVE

## 2014-06-15 LAB — WET PREP, GENITAL
Clue Cells Wet Prep HPF POC: NONE SEEN
TRICH WET PREP: NONE SEEN
Yeast Wet Prep HPF POC: NONE SEEN

## 2014-06-15 LAB — POC URINE PREG, ED: Preg Test, Ur: NEGATIVE

## 2014-06-15 LAB — LIPASE, BLOOD: Lipase: 26 U/L (ref 11–59)

## 2014-06-15 MED ORDER — ONDANSETRON HCL 4 MG/2ML IJ SOLN
4.0000 mg | Freq: Once | INTRAMUSCULAR | Status: AC
  Administered 2014-06-15: 4 mg via INTRAVENOUS
  Filled 2014-06-15: qty 2

## 2014-06-15 MED ORDER — CEFTRIAXONE SODIUM 250 MG IJ SOLR
250.0000 mg | Freq: Once | INTRAMUSCULAR | Status: AC
  Administered 2014-06-15: 250 mg via INTRAMUSCULAR
  Filled 2014-06-15: qty 250

## 2014-06-15 MED ORDER — LIDOCAINE HCL (PF) 1 % IJ SOLN
5.0000 mL | Freq: Once | INTRAMUSCULAR | Status: AC
  Administered 2014-06-15: 5 mL
  Filled 2014-06-15: qty 5

## 2014-06-15 MED ORDER — MORPHINE SULFATE 4 MG/ML IJ SOLN
6.0000 mg | Freq: Once | INTRAMUSCULAR | Status: AC
  Administered 2014-06-15: 2.2 mg via INTRAVENOUS
  Filled 2014-06-15: qty 2

## 2014-06-15 MED ORDER — AZITHROMYCIN 250 MG PO TABS
500.0000 mg | ORAL_TABLET | Freq: Once | ORAL | Status: AC
  Administered 2014-06-15: 500 mg via ORAL
  Filled 2014-06-15: qty 2

## 2014-06-15 MED ORDER — LIDOCAINE HCL (PF) 1 % IJ SOLN: 1.0000 mL | Freq: Once | INTRAMUSCULAR | Status: DC

## 2014-06-15 MED ORDER — SODIUM CHLORIDE 0.9 % IV BOLUS (SEPSIS)
1000.0000 mL | Freq: Once | INTRAVENOUS | Status: AC
  Administered 2014-06-15: 1000 mL via INTRAVENOUS

## 2014-06-15 NOTE — Discharge Instructions (Signed)
Cervicitis (Cervicitis) You were treated for a cervical infection.  Your sexual partners need to be tested for infection as well.  Follow up with a primary care physician within 3 days for continued management. If symptoms worsen come back to emergency department immediately. Thank you. Es la irritacin y dolor (inflamacin) en el cuello del tero. CUIDADEngineer, miningS EN EL HOGAR  No tenga sexo (relaciones sexuales) hasta que el mdico la autorice.  No tenga relaciones sexuales hasta que su pareja reciba tratamiento si el mdico lo indica.  Debe tomar los medicamentos antibiticos tal como se le indic. Tmelos todos, aunque se sienta mejor. SOLICITE AYUDA SI:   Los sntomas vuelven a aparecer.  Tiene fiebre. ASEGRESE DE QUE:   Comprende estas instrucciones.  Controlar su afeccin.  Recibir ayuda de inmediato si no mejora o si empeora. Document Released: 08/11/2008 Document Revised: 01/15/2013 [facility] Patient Information 2015 [place]. This information is not intended to replace advice given to you by your health care provider. Make sure you discuss any questions you have with your health care provider.

## 2014-06-15 NOTE — ED Provider Notes (Signed)
CSN: [id number]     Arrival date & time 06/15/14  0448 History   First MD Initiated Contact with Patient 06/15/14 0501     Chief Complaint  Patient presents with  . Abdominal Pain     (Consider location/radiation/quality/duration/timing/severity/associated sxs/prior Treatment) HPI  Kristy Martin is a 24 y.o. female with past medical history of cholelithiasis presenting today with abdominal pain. Patient states this began yesterday and has persisted until today with nausea or vomiting. It is a cramping pain in the right upper quadrant and radiates to her mid epigastrium. She denies eating greasy or fatty meals. Nothing has made her symptoms better or worse. Patient also admits to irregular menstrual cycle, she has not had a menstrual cycle in 2-3 months. She admits to recent unprotected sex. She's had no vaginal discharge. She denies any fevers or recent infections. Patient has no further complaints, she is accompanied by her mother.  10 Systems reviewed and are negative for acute change except as noted in the HPI.      Past Medical History  Diagnosis Date  . Asthma   . No pertinent past medical history   . Gallstones    Past Surgical History  Procedure Laterality Date  . No past surgeries     History reviewed. No pertinent family history. History  Substance Use Topics  . Smoking status: Never Smoker   . Smokeless tobacco: Never Used  . Alcohol Use: No   OB History    Gravida Para Term Preterm AB TAB SAB Ectopic Multiple Living   0 0 0 0 0 0 1     Review of Systems    Allergies  Review of patient's allergies indicates no known allergies.  Home Medications   Prior to Admission medications   Medication Sig Start Date End Date Taking? Authorizing Provider  HYDROcodone-acetaminophen (NORCO/VICODIN) 5-325 MG per tablet Take 2 tablets by mouth every 4 (four) hours as needed for moderate pain or severe pain. 02/21/14   Elson Areas, PA-C   BP 127/64 mmHg   Pulse 53  Temp(Src) 97.9 F (36.6 C) (Oral)  Resp 26  Ht  (1.549 m)  Wt 150 lb (68.04 kg)  BMI 28.36 kg/m2  SpO2 98%  LMP 04/13/2014 (Within Weeks) Physical Exam  Constitutional: She is oriented to person, place, and time. She appears well-developed and well-nourished. No distress.  HENT:  Head: Normocephalic and atraumatic.  Nose: Nose normal.  Mouth/Throat: Oropharynx is clear and moist. No oropharyngeal exudate.  Eyes: Conjunctivae and EOM are normal. Pupils are equal, round, and reactive to light. No scleral icterus.  Neck: Normal range of motion. Neck supple. No JVD present. No tracheal deviation present. No thyromegaly present.  Cardiovascular: Normal rate, regular rhythm and normal heart sounds.  Exam reveals no gallop and no friction rub.   No murmur heard. Pulmonary/Chest: Effort normal and breath sounds normal. No respiratory distress. She has no wheezes. She exhibits no tenderness.  Abdominal: Soft. Bowel sounds are normal. She exhibits no distension and no mass. There is tenderness. There is no rebound and no guarding.  Suprapubic tenderness to palpation. Negative Murphy sign, no tenderness in the right upper quadrant.  Genitourinary: Vaginal discharge found.  Moderate amounts of discharge seen in the vaginal vault. No CMT no adnexal tenderness.  Musculoskeletal: Normal range of motion. She exhibits no edema or tenderness.  Lymphadenopathy:    She has no cervical adenopathy.  Neurological: She is alert and oriented to person,  place, and time. No cranial nerve deficit. She exhibits normal muscle tone.  Skin: Skin is warm and dry. No rash noted. She is not diaphoretic. No erythema. No pallor.  Nursing note and vitals reviewed.   ED Course  Procedures (including critical care time) Labs Review Labs Reviewed  WET PREP, GENITAL - Abnormal; Notable for the following:    WBC, Wet Prep HPF POC MANY (*)    All other components within normal limits  CBC WITH  DIFFERENTIAL - Abnormal; Notable for the following:    Lymphs Abs 4.3 (*)    All other components within normal limits  URINALYSIS, ROUTINE W REFLEX MICROSCOPIC - Abnormal; Notable for the following:    APPearance CLOUDY (*)    All other components within normal limits  COMPREHENSIVE METABOLIC PANEL  LIPASE, BLOOD  POC URINE PREG, ED  GC/CHLAMYDIA PROBE AMP ([place])    Imaging Review No results found.   EKG Interpretation None      MDM   Final diagnoses:  None    Patient presents emergency department out of concern for abdominal pain. Patient states this pain is different because it is also in her midepigastrium. Common issue with her history of recent unprotected sex and irregular menstrual cycle, will obtain pregnancy test and pelvic exam. She was given morphine Zofran and fluids in the emergency department.  Patient symptoms have resolved with treatment. Pelvic exam reveals a cervicitis, thus consistent with her history of unprotected sex. She was treated with ceftriaxone azithromycin emergency department. She is advised that her partners tested as well. Laboratory studies are stable, right upper quadrant is nontender, there is no need for repeat ultrasound at this time. Her vital signs remain within her normal limits and she is safe for discharge.    Tomasita CrumbleAdeleke Daila Elbert, MD 06/15/14 [phone]

## 2014-06-15 NOTE — ED Notes (Signed)
Pt states that she started having right upper quadrant abdominal pain and vomiting since yesterday afternoon. Pt with hx of gallstones.

## 2014-10-14 ENCOUNTER — Emergency Department (HOSPITAL_COMMUNITY): Payer: Self-pay

## 2014-10-14 ENCOUNTER — Emergency Department (HOSPITAL_COMMUNITY)
Admission: EM | Admit: 2014-10-14 | Discharge: 2014-10-14 | Disposition: A | Discharge status: Home / Self Care | Payer: Self-pay | Attending: Emergency Medicine | Admitting: Emergency Medicine

## 2014-10-14 ENCOUNTER — Encounter (HOSPITAL_COMMUNITY): Payer: Self-pay | Admitting: Family Medicine

## 2014-10-14 DIAGNOSIS — Z3202 Encounter for pregnancy test, result negative: Secondary | ICD-10-CM | POA: Insufficient documentation

## 2014-10-14 DIAGNOSIS — J45909 Unspecified asthma, uncomplicated: Secondary | ICD-10-CM | POA: Insufficient documentation

## 2014-10-14 DIAGNOSIS — R1013 Epigastric pain: Secondary | ICD-10-CM

## 2014-10-14 DIAGNOSIS — R1011 Right upper quadrant pain: Secondary | ICD-10-CM

## 2014-10-14 DIAGNOSIS — K8050 Calculus of bile duct without cholangitis or cholecystitis without obstruction: Secondary | ICD-10-CM | POA: Insufficient documentation

## 2014-10-14 LAB — URINALYSIS, ROUTINE W REFLEX MICROSCOPIC
BILIRUBIN URINE: NEGATIVE
Glucose, UA: NEGATIVE mg/dL
Hgb urine dipstick: NEGATIVE
Ketones, ur: NEGATIVE mg/dL
Leukocytes, UA: NEGATIVE
Nitrite: NEGATIVE
PH: 8 (ref 5.0–8.0)
Protein, ur: NEGATIVE mg/dL
SPECIFIC GRAVITY, URINE: 1.012 (ref 1.005–1.030)
UROBILINOGEN UA: 0.2 mg/dL (ref 0.0–1.0)

## 2014-10-14 LAB — CBC WITH DIFFERENTIAL/PLATELET
Basophils Absolute: 0 10*3/uL (ref 0.0–0.1)
Basophils Relative: 0 % (ref 0–1)
EOS PCT: 1 % (ref 0–5)
Eosinophils Absolute: 0.1 10*3/uL (ref 0.0–0.7)
HEMATOCRIT: 42 % (ref 36.0–46.0)
Hemoglobin: 14 g/dL (ref 12.0–15.0)
Lymphocytes Relative: 26 % (ref 12–46)
Lymphs Abs: 2.5 10*3/uL (ref 0.7–4.0)
MCH: 29.2 pg (ref 26.0–34.0)
MCHC: 33.3 g/dL (ref 30.0–36.0)
MCV: 87.7 fL (ref 78.0–100.0)
MONO ABS: 0.4 10*3/uL (ref 0.1–1.0)
MONOS PCT: 4 % (ref 3–12)
Neutro Abs: 6.5 10*3/uL (ref 1.7–7.7)
Neutrophils Relative %: 69 % (ref 43–77)
Platelets: 294 10*3/uL (ref 150–400)
RBC: 4.79 MIL/uL (ref 3.87–5.11)
RDW: 13.2 % (ref 11.5–15.5)
WBC: 9.4 10*3/uL (ref 4.0–10.5)

## 2014-10-14 LAB — POC URINE PREG, ED: Preg Test, Ur: NEGATIVE

## 2014-10-14 LAB — COMPREHENSIVE METABOLIC PANEL
ALT: 22 U/L (ref 14–54)
ANION GAP: 7 (ref 5–15)
AST: 28 U/L (ref 15–41)
Albumin: 3.7 g/dL (ref 3.5–5.0)
Alkaline Phosphatase: 98 U/L (ref 38–126)
BUN: 8 mg/dL (ref 6–20)
CALCIUM: 9.3 mg/dL (ref 8.9–10.3)
CHLORIDE: 104 mmol/L (ref 101–111)
CO2: 25 mmol/L (ref 22–32)
CREATININE: 0.67 mg/dL (ref 0.44–1.00)
GFR calc Af Amer: >60 mL/min (ref >60)
GLUCOSE: 109 mg/dL — AB (ref 65–99)
Potassium: 4.1 mmol/L (ref 3.5–5.1)
Sodium: 136 mmol/L (ref 135–145)
TOTAL PROTEIN: 7 g/dL (ref 6.5–8.1)
Total Bilirubin: 0.5 mg/dL (ref 0.3–1.2)

## 2014-10-14 LAB — LIPASE, BLOOD: Lipase: 26 U/L (ref 22–51)

## 2014-10-14 MED ORDER — HYDROCODONE-ACETAMINOPHEN 5-325 MG PO TABS: 1.0000 | ORAL_TABLET | Freq: Four times a day (QID) | ORAL | Status: DC | PRN

## 2014-10-14 MED ORDER — PROMETHAZINE HCL 25 MG PO TABS
25.0000 mg | ORAL_TABLET | Freq: Once | ORAL | Status: AC
  Administered 2014-10-14: 25 mg via ORAL
  Filled 2014-10-14: qty 1

## 2014-10-14 MED ORDER — PROMETHAZINE HCL 25 MG PO TABS: 25.0000 mg | ORAL_TABLET | Freq: Four times a day (QID) | ORAL | Status: DC | PRN

## 2014-10-14 MED ORDER — HYDROMORPHONE HCL 1 MG/ML IJ SOLN
1.0000 mg | Freq: Once | INTRAMUSCULAR | Status: AC
  Administered 2014-10-14: 1 mg via INTRAVENOUS
  Filled 2014-10-14: qty 1

## 2014-10-14 MED ORDER — ONDANSETRON HCL 4 MG/2ML IJ SOLN
4.0000 mg | Freq: Once | INTRAMUSCULAR | Status: AC
  Administered 2014-10-14: 4 mg via INTRAVENOUS
  Filled 2014-10-14: qty 2

## 2014-10-14 NOTE — ED Provider Notes (Signed)
CSN: [id number]     Arrival date & time 10/14/14  0820 History   First MD Initiated Contact with Patient 10/14/14 [phone]     Chief Complaint  Patient presents with  . Abdominal Pain  . Emesis     (Consider location/radiation/quality/duration/timing/severity/associated sxs/prior Treatment) Patient is a 24 y.o. female presenting with abdominal pain and vomiting. The history is provided by the patient. No language interpreter was used.  Abdominal Pain Pain location:  RUQ and epigastric Pain quality: aching and sharp  Dull: 10.   Pain radiates to:  Does not radiate Pain severity:  Moderate Onset quality:  Sudden Duration:  10 hours Timing:  Constant Progression:  Unchanged Chronicity:  Recurrent Relieved by:  Nothing Worsened by:  Nothing tried Ineffective treatments:  OTC medications Associated symptoms: nausea and vomiting   Associated symptoms: no chest pain, no chills, no constipation, no cough, no diarrhea, no dysuria, no fatigue, no fever, no shortness of breath and no sore throat   Emesis Associated symptoms: abdominal pain   Associated symptoms: no arthralgias, no chills, no diarrhea, no headaches and no sore throat     Past Medical History  Diagnosis Date  . Asthma   . No pertinent past medical history   . Gallstones    Past Surgical History  Procedure Laterality Date  . No past surgeries     History reviewed. No pertinent family history. History  Substance Use Topics  . Smoking status: Never Smoker   . Smokeless tobacco: Never Used  . Alcohol Use: No   OB History    Gravida Para Term Preterm AB TAB SAB Ectopic Multiple Living   1 1 1  0 0 0 0 0 0 1     Review of Systems  Constitutional: Negative for fever, chills, diaphoresis, activity change, appetite change and fatigue.  HENT: Negative for congestion, facial swelling, rhinorrhea and sore throat.   Eyes: Negative for photophobia and discharge.  Respiratory: Negative for cough, chest tightness and  shortness of breath.   Cardiovascular: Negative for chest pain, palpitations and leg swelling.  Gastrointestinal: Positive for nausea, vomiting and abdominal pain. Negative for diarrhea and constipation.  Endocrine: Negative for polydipsia and polyuria.  Genitourinary: Negative for dysuria, frequency, difficulty urinating and pelvic pain.  Musculoskeletal: Negative for back pain, arthralgias, neck pain and neck stiffness.  Skin: Negative for color change and wound.  Allergic/Immunologic: Negative for immunocompromised state.  Neurological: Negative for facial asymmetry, weakness, numbness and headaches.  Hematological: Does not bruise/bleed easily.  Psychiatric/Behavioral: Negative for confusion and agitation.      Allergies  Review of patient's allergies indicates no known allergies.  Home Medications   Prior to Admission medications   Medication Sig Start Date End Date Taking? Authorizing Provider  HYDROcodone-acetaminophen (NORCO/VICODIN) 5-325 MG per tablet Take 2 tablets by mouth every 4 (four) hours as needed for moderate pain or severe pain. Patient not taking: Reported on 06/15/2014 02/21/14   Elson AreasLeslie K Sofia, PA-C   BP 131/77 mmHg  Pulse 71  Temp(Src) 97.7 F (36.5 C)  Resp 18  SpO2 99%  LMP 10/07/2014 Physical Exam  Constitutional: She is oriented to person, place, and time. She appears well-developed and well-nourished. No distress.  HENT:  Head: Normocephalic and atraumatic.  Mouth/Throat: No oropharyngeal exudate.  Eyes: Pupils are equal, round, and reactive to light.  Neck: Normal range of motion. Neck supple.  Cardiovascular: Normal rate, regular rhythm and normal heart sounds.  Exam reveals no gallop and no friction rub.  No murmur heard. Pulmonary/Chest: Effort normal and breath sounds normal. No respiratory distress. She has no wheezes. She has no rales.  Abdominal: Soft. Bowel sounds are normal. She exhibits no distension and no mass. There is tenderness in  the right upper quadrant and epigastric area. There is no rigidity, no rebound, no guarding and negative Murphy's sign.  Musculoskeletal: Normal range of motion. She exhibits no edema or tenderness.  Neurological: She is alert and oriented to person, place, and time.  Skin: Skin is warm and dry.  Psychiatric: She has a normal mood and affect.    ED Course  Procedures (including critical care time) Labs Review Labs Reviewed  COMPREHENSIVE METABOLIC PANEL - Abnormal; Notable for the following:    Glucose, Bld 109 (*)    All other components within normal limits  CBC WITH DIFFERENTIAL/PLATELET  LIPASE, BLOOD  URINALYSIS, ROUTINE W REFLEX MICROSCOPIC  POC URINE PREG, ED    Imaging Review No results found.   EKG Interpretation None      MDM   Final diagnoses:  Epigastric pain  RUQ pain    Pt is a 24 y.o. female with Pmhx as above who presents with about 9-1/2 hours of constant epigastric and right upper quadrant pain with associated nausea and one episode of vomiting.  She states that this feels like similar episodes of biliary colic, though is more severe.  She has taken Tylenol without relief.  She denies fevers or chills, urinary symptoms or diarrhea.  She states she [place] Derrell Lollingamirez [facility] [place] [facility] about a month ago who told her to come back when she was prepared for cholecystectomy.  On physical exam, vital signs are stable.  Patient is in no acute distress.  She is positive epigastric and right upper quadrant tenderness without rebound or guarding and a negative Murphy's Sign. Will treat symptomatically and screen for cholecystitis.   12:09 PM Pt is not longer having pain, just dizziness & nausea.   Other Ultrasound shows a 1.2 cm gallstone in the gallbladder neck, which is unchanged from 2 prior ultrasounds.  There is no evidence of cholecystitis, based on laboratory findings ultrasound findings or physical exam.  I encouraged patient to follow up with Dr. Elnita MaxwellMary's  [facility] [place] [facility] as well as establish [facility] with [facility] who can assist with prescriptions and medical financial planning.   Francisco CapuchinBarbara F Garcia [place] evaluation in the Emergency Department is complete. It has been determined that no acute conditions requiring further emergency intervention are present at this time. The patient/guardian have been advised of the diagnosis and plan. We have discussed signs and symptoms that warrant return to the ED, such as changes or worsening in symptoms, worsening pain, fever, inability to tolerate liquids.       Toy CookeyMegan Docherty, MD 10/14/14 [phone]

## 2014-10-14 NOTE — ED Notes (Signed)
Pt here for right sided abd pain and vomiting since last night.

## 2014-10-14 NOTE — ED Notes (Signed)
Pt given PO fluids, unable to tolerate, pt vomiting. Md notified

## 2014-10-14 NOTE — Discharge Instructions (Signed)
Biliary Colic  °Biliary colic is a steady or irregular pain in the upper abdomen. It is usually under the right side of the rib cage. It happens when gallstones interfere with the normal flow of bile from the gallbladder. Bile is a liquid that helps to digest fats. Bile is made in the liver and stored in the gallbladder. When you eat a meal, bile passes from the gallbladder through the cystic duct and the common bile duct into the small intestine. There, it mixes with partially digested food. If a gallstone blocks either of these ducts, the normal flow of bile is blocked. The muscle cells in the bile duct contract forcefully to try to move the stone. This causes the pain of biliary colic.  °SYMPTOMS  °· A person with biliary colic usually complains of pain in the upper abdomen. This pain can be: °¨ In the center of the upper abdomen just below the breastbone. °¨ In the upper-right part of the abdomen, near the gallbladder and liver. °¨ Spread back toward the right shoulder blade. °· Nausea and vomiting. °· The pain usually occurs after eating. °· Biliary colic is usually triggered by the digestive system's demand for bile. The demand for bile is high after fatty meals. Symptoms can also occur when a person who has been fasting suddenly eats a very large meal. Most episodes of biliary colic pass after 1 to 5 hours. After the most intense pain passes, your abdomen may continue to ache mildly for about 24 hours. °DIAGNOSIS  °After you describe your symptoms, your caregiver will perform a physical exam. He or she will pay attention to the upper right portion of your belly (abdomen). This is the area of your liver and gallbladder. An ultrasound will help your caregiver look for gallstones. Specialized scans of the gallbladder may also be done. Blood tests may be done, especially if you have fever or if your pain persists. °PREVENTION  °Biliary colic can be prevented by controlling the risk factors for gallstones. Some of  these risk factors, such as heredity, increasing age, and pregnancy are a normal part of life. Obesity and a high-fat diet are risk factors you can change through a healthy lifestyle. Women going through menopause who take hormone replacement therapy (estrogen) are also more likely to develop biliary colic. °TREATMENT  °· Pain medication may be prescribed. °· You may be encouraged to eat a fat-free diet. °· If the first episode of biliary colic is severe, or episodes of colic keep retuning, surgery to remove the gallbladder (cholecystectomy) is usually recommended. This procedure can be done through small incisions using an instrument called a laparoscope. The procedure often requires a brief stay in the hospital. Some people can leave the hospital the same day. It is the most widely used treatment in people troubled by painful gallstones. It is effective and safe, with no complications in more than 90% of cases. °· If surgery cannot be done, medication that dissolves gallstones may be used. This medication is expensive and can take months or years to work. Only small stones will dissolve. °· Rarely, medication to dissolve gallstones is combined with a procedure called shock-wave lithotripsy. This procedure uses carefully aimed shock waves to break up gallstones. In many people treated with this procedure, gallstones form again within a few years. °PROGNOSIS  °If gallstones block your cystic duct or common bile duct, you are at risk for repeated episodes of biliary colic. There is also a 25% chance that you will develop   a gallbladder infection(acute cholecystitis), or some other complication of gallstones within 10 to 20 years. If you have surgery, schedule it at a time that is convenient for you and at a time when you are not sick. °HOME CARE INSTRUCTIONS  °· Drink plenty of clear fluids. °· Avoid fatty, greasy or fried foods, or any foods that make your pain worse. °· Take medications as directed. °SEEK MEDICAL  CARE IF:  °· You develop a fever over 100.5° F (38.1° C). °· Your pain gets worse over time. °· You develop nausea that prevents you from eating and drinking. °· You develop vomiting. °SEEK IMMEDIATE MEDICAL CARE IF:  °· You have continuous or severe belly (abdominal) pain which is not relieved with medications. °· You develop nausea and vomiting which is not relieved with medications. °· You have symptoms of biliary colic and you suddenly develop a fever and shaking chills. This may signal cholecystitis. Call your caregiver immediately. °· You develop a yellow color to your skin or the white part of your eyes (jaundice). °Document Released: 10/16/2005 Document Revised: 08/07/2011 Document Reviewed: 12/26/2007 °ExitCare® Patient Information ©2015 [employer]. This information is not intended to replace advice given to you by your health care provider. Make sure you discuss any questions you have with your health care provider. ° °

## 2016-12-10 DIAGNOSIS — Y929 Unspecified place or not applicable: Secondary | ICD-10-CM | POA: Insufficient documentation

## 2016-12-10 DIAGNOSIS — S93401A Sprain of unspecified ligament of right ankle, initial encounter: Secondary | ICD-10-CM | POA: Insufficient documentation

## 2016-12-10 DIAGNOSIS — W010XXA Fall on same level from slipping, tripping and stumbling without subsequent striking against object, initial encounter: Secondary | ICD-10-CM | POA: Insufficient documentation

## 2016-12-10 DIAGNOSIS — J45909 Unspecified asthma, uncomplicated: Secondary | ICD-10-CM | POA: Insufficient documentation

## 2016-12-10 DIAGNOSIS — Y998 Other external cause status: Secondary | ICD-10-CM | POA: Insufficient documentation

## 2016-12-10 DIAGNOSIS — Y9389 Activity, other specified: Secondary | ICD-10-CM | POA: Insufficient documentation

## 2016-12-11 ENCOUNTER — Emergency Department (HOSPITAL_COMMUNITY): Payer: Self-pay

## 2016-12-11 ENCOUNTER — Emergency Department (HOSPITAL_COMMUNITY)
Admission: EM | Admit: 2016-12-11 | Discharge: 2016-12-11 | Disposition: A | Discharge status: Home / Self Care | Payer: Self-pay | Attending: Emergency Medicine | Admitting: Emergency Medicine

## 2016-12-11 ENCOUNTER — Encounter (HOSPITAL_COMMUNITY): Payer: Self-pay

## 2016-12-11 DIAGNOSIS — S93401A Sprain of unspecified ligament of right ankle, initial encounter: Secondary | ICD-10-CM

## 2016-12-11 MED ORDER — HYDROCODONE-ACETAMINOPHEN 5-325 MG PO TABS
1.0000 | ORAL_TABLET | Freq: Once | ORAL | Status: AC
  Administered 2016-12-11: 1 via ORAL
  Filled 2016-12-11: qty 1

## 2016-12-11 MED ORDER — NAPROXEN 500 MG PO TABS: 500.0000 mg | ORAL_TABLET | Freq: Two times a day (BID) | ORAL | 0 refills | Status: DC

## 2016-12-11 NOTE — ED Notes (Signed)
ED Provider at bedside. 

## 2016-12-11 NOTE — ED Triage Notes (Signed)
Pt states she was running from dog and rolled right ankle and fell.  Pt has swelling to right ankle and abrasion to left knee

## 2016-12-11 NOTE — Discharge Instructions (Signed)
You have a sprain to right ankle. Continue to elevate, ice. Take naproxen as needed for pain. Ambulate as tolerated.

## 2016-12-11 NOTE — ED Provider Notes (Signed)
[facility]-EMERGENCY DEPT Provider Note   CSN: [id number] Arrival date & time: 12/10/16  2351   By signing my name below, I, Clarisse Gouge, attest that this documentation has been prepared under the direction and in the presence of Horton, Mayer Masker, MD. Electronically signed, Clarisse Gouge, ED Scribe. 12/11/16. 1:14 AM.  History   Chief Complaint Chief Complaint  Patient presents with  . Ankle Pain   The history is provided by the patient and medical records. No language interpreter was used.    Kristy Martin is a 26 y.o. female presenting to the Emergency Department concerning R ankle pain s/p a fall that occurred shortly PTA. Pt states she rolled her ankle and fell while running from a dog. No head trauma/ LOC. She notes L knee abrasion from the fall and swelling to the R ankle. 10/10 pain on evaluation. No PTA medications. No knee pain. No other complaints at this time. Tetanus UTD.  Past Medical History:  Diagnosis Date  . Asthma   . Gallstones   . No pertinent past medical history     Patient Active Problem List   Diagnosis Date Noted  . Normal vaginal delivery 12/06/2010    Past Surgical History:  Procedure Laterality Date  . NO PAST SURGERIES      OB History    Gravida Para Term Preterm AB Living   1 1 1  0 0 1   SAB TAB Ectopic Multiple Live Births   0 0 0 0 1       Home Medications    Prior to Admission medications   Medication Sig Start Date End Date Taking? Authorizing Provider  acetaminophen (TYLENOL) 325 MG tablet Take 650 mg by mouth every 6 (six) hours as needed for mild pain.    [provider]  HYDROcodone-acetaminophen (NORCO/VICODIN) 5-325 MG per tablet Take 1 tablet by mouth every 6 (six) hours as needed. 10/14/14   Toy Cookey, MD  naproxen (NAPROSYN) 500 MG tablet Take 1 tablet (500 mg total) by mouth 2 (two) times daily. 12/11/16   Horton, Mayer Masker, MD  promethazine (PHENERGAN) 25 MG tablet Take 1 tablet (25 mg total) by  mouth every 6 (six) hours as needed for nausea or vomiting. 10/14/14   Toy Cookey, MD    Family History No family history on file.  Social History Social History  Substance Use Topics  . Smoking status: Never Smoker  . Smokeless tobacco: Never Used  . Alcohol use No     Allergies   Patient has no known allergies.   Review of Systems Review of Systems  HENT: Negative for facial swelling.   Musculoskeletal: Positive for arthralgias, gait problem and joint swelling.  Skin: Positive for wound (mild abrasion to R knee).  Neurological: Negative for syncope, weakness, numbness and headaches.  Psychiatric/Behavioral: Negative for confusion.  All other systems reviewed and are negative.    Physical Exam Updated Vital Signs BP 128/82 (BP Location: Left Arm)   Pulse 60   Temp 97.9 F (36.6 C) (Oral)   Resp 16   Ht 5\' 1"  (1.549 m)   Wt 170 lb (77.1 kg)   LMP 10/24/2016   SpO2 99%   BMI 32.12 kg/m   Physical Exam  Constitutional: She is oriented to person, place, and time. She appears well-developed and well-nourished.  HENT:  Head: Normocephalic and atraumatic.  Cardiovascular: Normal rate and regular rhythm.   Pulmonary/Chest: Effort normal. No respiratory distress.  Musculoskeletal:  Decreased range of motion  of the right ankle, swelling noted over the lateral malleolus with tenderness to palpation, no obvious deformity, 2+ DP pulse, no proximal fibular tenderness.  Neurological: She is alert and oriented to person, place, and time.  Skin: Skin is warm and dry.  Abrasion left lower extremity  Psychiatric: She has a normal mood and affect.  Nursing note and vitals reviewed.    ED Treatments / Results  DIAGNOSTIC STUDIES: Oxygen Saturation is 99% on RA, NL by my interpretation.    COORDINATION OF CARE: 1:10 AM-Discussed next steps with pt. Pt verbalized understanding and is agreeable with the plan. Will order pain medications, ACE wrap and crutches.    Labs (all labs ordered are listed, but only abnormal results are displayed) Labs Reviewed - No data to display  EKG  EKG Interpretation None       Radiology Dg Ankle Complete Right  Result Date: 12/11/2016 CLINICAL DATA:  26 year old female with right ankle pain. EXAM: RIGHT ANKLE - COMPLETE 3+ VIEW COMPARISON:  None. FINDINGS: There is no acute fracture or dislocation. The bones are well mineralized. No arthritic changes. The ankle mortise is intact. There is soft tissue swelling over the lateral malleolus. No radiopaque foreign object. IMPRESSION: 1. No acute fracture or dislocation. 2. Soft tissue swelling over the lateral malleolus. Underlying ligamentous injury is not excluded. Clinical correlation is recommended. Electronically Signed   By: Elgie CollardArash  Radparvar M.D.   On: 12/11/2016 00:23    Procedures Procedures (including critical care time)  Medications Ordered in ED Medications  HYDROcodone-acetaminophen (NORCO/VICODIN) 5-325 MG per tablet 1 tablet (1 tablet Oral Given 12/11/16 0126)     Initial Impression / Assessment and Plan / ED Course  I have reviewed the triage vital signs and the nursing notes.  Pertinent labs & imaging results that were available during my care of the patient were reviewed by me and considered in my medical decision making (see chart for details).     Patient presents with injury to right ankle. X-rays negative. Suspect sprain. Discussed with patient rest, ice, compression, elevation, anti-inflammatory medications. She was provided with a brace and crutches.  After history, exam, and medical workup I feel the patient has been appropriately medically screened and is safe for discharge home. Pertinent diagnoses were discussed with the patient. Patient was given return precautions.   Final Clinical Impressions(s) / ED Diagnoses   Final diagnoses:  Sprain of right ankle, unspecified ligament, initial encounter    New Prescriptions New  Prescriptions   NAPROXEN (NAPROSYN) 500 MG TABLET    Take 1 tablet (500 mg total) by mouth 2 (two) times daily.   I personally performed the services described in this documentation, which was scribed in my presence. The recorded information has been reviewed and is accurate.    Shon BatonHorton, Courtney F, MD 12/11/16 [phone]

## 2017-01-11 ENCOUNTER — Ambulatory Visit (INDEPENDENT_AMBULATORY_CARE_PROVIDER_SITE_OTHER): Payer: Self-pay | Admitting: Physician Assistant

## 2017-07-23 ENCOUNTER — Other Ambulatory Visit: Payer: Self-pay

## 2017-07-30 LAB — CYTOLOGY - PAP: Diagnosis: NEGATIVE

## 2018-02-16 ENCOUNTER — Encounter (HOSPITAL_COMMUNITY): Payer: Self-pay | Admitting: Emergency Medicine

## 2018-02-16 ENCOUNTER — Emergency Department (HOSPITAL_COMMUNITY)
Admission: EM | Admit: 2018-02-16 | Discharge: 2018-02-16 | Disposition: A | Discharge status: Home / Self Care | Payer: Self-pay | Attending: Emergency Medicine | Admitting: Emergency Medicine

## 2018-02-16 ENCOUNTER — Other Ambulatory Visit: Payer: Self-pay

## 2018-02-16 DIAGNOSIS — R112 Nausea with vomiting, unspecified: Secondary | ICD-10-CM | POA: Insufficient documentation

## 2018-02-16 DIAGNOSIS — J45909 Unspecified asthma, uncomplicated: Secondary | ICD-10-CM | POA: Insufficient documentation

## 2018-02-16 DIAGNOSIS — R519 Headache, unspecified: Secondary | ICD-10-CM

## 2018-02-16 DIAGNOSIS — R51 Headache: Secondary | ICD-10-CM | POA: Insufficient documentation

## 2018-02-16 LAB — COMPREHENSIVE METABOLIC PANEL
ALT: 25 U/L (ref 0–44)
ANION GAP: 11 (ref 5–15)
AST: 25 U/L (ref 15–41)
Albumin: 4 g/dL (ref 3.5–5.0)
Alkaline Phosphatase: 85 U/L (ref 38–126)
BUN: 14 mg/dL (ref 6–20)
CO2: 22 mmol/L (ref 22–32)
Calcium: 9.3 mg/dL (ref 8.9–10.3)
Chloride: 104 mmol/L (ref 98–111)
Creatinine, Ser: 0.68 mg/dL (ref 0.44–1.00)
Glucose, Bld: 125 mg/dL — ABNORMAL HIGH (ref 70–99)
Potassium: 4 mmol/L (ref 3.5–5.1)
SODIUM: 137 mmol/L (ref 135–145)
Total Bilirubin: 0.7 mg/dL (ref 0.3–1.2)
Total Protein: 7.4 g/dL (ref 6.5–8.1)

## 2018-02-16 LAB — CBC
HCT: 44.5 % (ref 36.0–46.0)
Hemoglobin: 14.3 g/dL (ref 12.0–15.0)
MCH: 29.1 pg (ref 26.0–34.0)
MCHC: 32.1 g/dL (ref 30.0–36.0)
MCV: 90.4 fL (ref 78.0–100.0)
Platelets: 330 10*3/uL (ref 150–400)
RBC: 4.92 MIL/uL (ref 3.87–5.11)
RDW: 13.2 % (ref 11.5–15.5)
WBC: 9.5 10*3/uL (ref 4.0–10.5)

## 2018-02-16 LAB — I-STAT BETA HCG BLOOD, ED (MC, WL, AP ONLY)

## 2018-02-16 LAB — LIPASE, BLOOD: Lipase: 24 U/L (ref 11–51)

## 2018-02-16 MED ORDER — MECLIZINE HCL 25 MG PO TABS
25.0000 mg | ORAL_TABLET | Freq: Once | ORAL | Status: AC
  Administered 2018-02-16: 25 mg via ORAL
  Filled 2018-02-16: qty 1

## 2018-02-16 MED ORDER — DIPHENHYDRAMINE HCL 50 MG/ML IJ SOLN
25.0000 mg | Freq: Once | INTRAMUSCULAR | Status: AC
Start: 2018-02-16 — End: 2018-02-16
  Administered 2018-02-16: 25 mg via INTRAVENOUS
  Filled 2018-02-16: qty 1

## 2018-02-16 MED ORDER — METOCLOPRAMIDE HCL 5 MG/ML IJ SOLN
10.0000 mg | Freq: Once | INTRAMUSCULAR | Status: AC
  Administered 2018-02-16: 10 mg via INTRAVENOUS
  Filled 2018-02-16: qty 2

## 2018-02-16 MED ORDER — PROMETHAZINE HCL 25 MG PO TABS: 25.0000 mg | ORAL_TABLET | Freq: Four times a day (QID) | ORAL | 0 refills | Status: DC | PRN

## 2018-02-16 MED ORDER — BUTALBITAL-APAP-CAFFEINE 50-325-40 MG PO TABS: 1.0000 | ORAL_TABLET | Freq: Four times a day (QID) | ORAL | 0 refills | Status: AC | PRN

## 2018-02-16 MED ORDER — KETOROLAC TROMETHAMINE 30 MG/ML IJ SOLN
30.0000 mg | Freq: Once | INTRAMUSCULAR | Status: AC
  Administered 2018-02-16: 30 mg via INTRAVENOUS
  Filled 2018-02-16: qty 1

## 2018-02-16 MED ORDER — SODIUM CHLORIDE 0.9 % IV BOLUS
1000.0000 mL | Freq: Once | INTRAVENOUS | Status: AC
  Administered 2018-02-16: 1000 mL via INTRAVENOUS

## 2018-02-16 MED ORDER — ONDANSETRON 4 MG PO TBDP
4.0000 mg | ORAL_TABLET | Freq: Once | ORAL | Status: AC
  Administered 2018-02-16: 4 mg via ORAL
  Filled 2018-02-16: qty 1

## 2018-02-16 NOTE — ED Notes (Signed)
Patient able to ambulate independently  

## 2018-02-16 NOTE — ED Provider Notes (Signed)
[facility] EMERGENCY DEPARTMENT Provider Note   CSN: [id number] Arrival date & time: 02/16/18  0945     History   Chief Complaint Chief Complaint  Patient presents with  . Emesis  . Nausea  . Headache    HPI Kristy Martin is a 27 y.o. female.  The history is provided by the patient. No language interpreter was used.  Emesis   Associated symptoms include headaches.  Headache   Associated symptoms include vomiting.     27 year old female presenting complaining of headache.  Patient reports she woke up this morning complaining of dizziness.  She describes dizziness as room spinning sensation.  She endorsed feeling nauseous, has vomited multiple episodes of nonbloody nonbilious content and complaining of pain to the back of her head.  Headache is described as a throbbing sensation, moderate in severity without any neck pain.  No associated fever, chills, chest pain, shortness of breath, URI symptoms, back pain, dysuria or hematuria.  She did endorse some mild abdominal discomfort and states she has history of gallstones.  Past Medical History:  Diagnosis Date  . Asthma   . Gallstones   . No pertinent past medical history     Patient Active Problem List   Diagnosis Date Noted  . Normal vaginal delivery 12/06/2010    Past Surgical History:  Procedure Laterality Date  . NO PAST SURGERIES       OB History    Gravida  1   Para  1   Term  1   Preterm  0   AB  0   Living  1     SAB  0   TAB  0   Ectopic  0   Multiple  0   Live Births  1            Home Medications    Prior to Admission medications   Medication Sig Start Date End Date Taking? Authorizing Provider  acetaminophen (TYLENOL) 325 MG tablet Take 650 mg by mouth every 6 (six) hours as needed for mild pain.    [provider]  HYDROcodone-acetaminophen (NORCO/VICODIN) 5-325 MG per tablet Take 1 tablet by mouth every 6 (six) hours as needed. 10/14/14    Toy Cookey, MD  naproxen (NAPROSYN) 500 MG tablet Take 1 tablet (500 mg total) by mouth 2 (two) times daily. 12/11/16   Horton, Mayer Masker, MD  promethazine (PHENERGAN) 25 MG tablet Take 1 tablet (25 mg total) by mouth every 6 (six) hours as needed for nausea or vomiting. 10/14/14   Toy Cookey, MD    Family History No family history on file.  Social History Social History   Tobacco Use  . Smoking status: Never Smoker  . Smokeless tobacco: Never Used  Substance Use Topics  . Alcohol use: No  . Drug use: No     Allergies   Patient has no known allergies.   Review of Systems Review of Systems  Gastrointestinal: Positive for vomiting.  Neurological: Positive for headaches.  All other systems reviewed and are negative.    Physical Exam Updated Vital Signs BP 133/76 (BP Location: Right Arm)   Pulse 63   Temp 98.1 F (36.7 C) (Oral)   Resp 18   Ht 5\' 1"  (1.549 m)   Wt 76.2 kg   LMP 10/16/2017   SpO2 99%   BMI 31.74 kg/m   Physical Exam  Constitutional: She appears well-developed and well-nourished. No distress.  Obese female nontoxic in  appearance  HENT:  Head: Atraumatic.  TMs normal bilaterally  Eyes: Pupils are equal, round, and reactive to light. Conjunctivae and EOM are normal.  Horizontal nystagmus bilaterally  Neck: Normal range of motion. Neck supple.  No nuchal rigidity  Cardiovascular: Normal rate and regular rhythm.  Pulmonary/Chest: Effort normal and breath sounds normal.  Abdominal: Soft. Bowel sounds are normal. She exhibits no distension. There is no tenderness.  Neurological: She is alert. She has normal strength. No cranial nerve deficit or sensory deficit. She displays a negative Romberg sign. Coordination normal. GCS eye subscore is 4. GCS verbal subscore is 5. GCS motor subscore is 6.  Skin: No rash noted.  Psychiatric: She has a normal mood and affect.  Nursing note and vitals reviewed.    ED Treatments / Results  Labs (all  labs ordered are listed, but only abnormal results are displayed) Labs Reviewed  COMPREHENSIVE METABOLIC PANEL - Abnormal; Notable for the following components:      Result Value   Glucose, Bld 125 (*)    All other components within normal limits  LIPASE, BLOOD  CBC  URINALYSIS, ROUTINE W REFLEX MICROSCOPIC  I-STAT BETA HCG BLOOD, ED ([facility], [facility], [facility] ONLY)    EKG None  Radiology No results found.  Procedures Procedures (including critical care time)  Medications Ordered in ED Medications  ondansetron (ZOFRAN-ODT) disintegrating tablet 4 mg (4 mg Oral Given 02/16/18 1013)  meclizine (ANTIVERT) tablet 25 mg (25 mg Oral Given 02/16/18 1229)  sodium chloride 0.9 % bolus 1,000 mL (0 mLs Intravenous Stopped 02/16/18 1341)  diphenhydrAMINE (BENADRYL) injection 25 mg (25 mg Intravenous Given 02/16/18 1229)  metoCLOPramide (REGLAN) injection 10 mg (10 mg Intravenous Given 02/16/18 1230)  ketorolac (TORADOL) 30 MG/ML injection 30 mg (30 mg Intravenous Given 02/16/18 1230)     Initial Impression / Assessment and Plan / ED Course  I have reviewed the triage vital signs and the nursing notes.  Pertinent labs & imaging results that were available during my care of the patient were reviewed by me and considered in my medical decision making (see chart for details).     BP 133/76 (BP Location: Right Arm)   Pulse 63   Temp 98.1 F (36.7 C) (Oral)   Resp 18   Ht 5\' 1"  (1.549 m)   Wt 76.2 kg   LMP 10/16/2017   SpO2 99%   BMI 31.74 kg/m    Final Clinical Impressions(s) / ED Diagnoses   Final diagnoses:  Bad headache  Non-intractable vomiting with nausea, unspecified vomiting type    ED Discharge Orders         Ordered    butalbital-acetaminophen-caffeine (FIORICET, ESGIC) 50-325-40 MG tablet  Every 6 hours PRN     02/16/18 1509    promethazine (PHENERGAN) 25 MG tablet  Every 6 hours PRN     02/16/18 1509         12:11 PM Patient here with complaints of dizziness and headache.   Dizziness brought on by movement likely peripheral vertigo.  Negative hints exam, low suspicion for central cause.  Headache is occipital, without nuchal rigidity concerning for meningitis, no focal neuro deficit concerning for strokes or space-occupying lesion, no sudden onset thunderclap headache concerning for subarachnoid hemorrhage, labs are reassuring.  Will provide symptomatically treatment, will monitor closely.  1:01 PM Pregnancy test is negative, normal lipase, electrolyte panels are reassuring, normal WBC and normal H&H.  Vital signs stable.  Patient resting comfortably currently receiving migraine cocktail.  3:10  PM Improvement of symptoms after migraine cocktail.  At this time patient stable for discharge with symptomatic treatment.  Return precautions discussed.   Fayrene Helperran, Signa Cheek, PA-C 02/16/18 1510    Shaune PollackIsaacs, Cameron, MD 02/17/18 [phone]

## 2018-02-16 NOTE — ED Triage Notes (Signed)
Pt. Stated, I woke up this morning with N/V and my head really hurts. Pt. Vomiting in triage

## 2019-03-11 ENCOUNTER — Other Ambulatory Visit: Payer: Self-pay | Admitting: Orthopedic Surgery

## 2019-03-15 ENCOUNTER — Other Ambulatory Visit (HOSPITAL_COMMUNITY): Admission: RE | Admit: 2019-03-15 | Payer: Self-pay | Source: Ambulatory Visit

## 2019-03-19 ENCOUNTER — Encounter (HOSPITAL_BASED_OUTPATIENT_CLINIC_OR_DEPARTMENT_OTHER): Payer: Self-pay

## 2019-03-19 ENCOUNTER — Ambulatory Visit (HOSPITAL_BASED_OUTPATIENT_CLINIC_OR_DEPARTMENT_OTHER): Admit: 2019-03-19 | Payer: Self-pay | Admitting: Orthopedic Surgery

## 2019-03-19 SURGERY — OPEN REDUCTION INTERNAL FIXATION (ORIF) DISTAL RADIUS FRACTURE
Anesthesia: General | Laterality: Bilateral

## 2019-10-13 ENCOUNTER — Ambulatory Visit: Payer: Self-pay | Attending: Internal Medicine

## 2019-10-13 DIAGNOSIS — Z23 Encounter for immunization: Secondary | ICD-10-CM

## 2019-10-13 NOTE — Progress Notes (Signed)
   Covid-19 Vaccination Clinic  Name:  Tennie Grussing    MRN: [id number] DOB: March 14, 1991  10/13/2019  Ms. Karoline Caldwell was observed post Covid-19 immunization for 15 minutes without incident. She was provided with Vaccine Information Sheet and instruction to access the V-Safe system.   Ms. Karoline Caldwell was instructed to call 911 with any severe reactions post vaccine: Marland Kitchen Difficulty breathing  . Swelling of face and throat  . A fast heartbeat  . A bad rash all over body  . Dizziness and weakness   Immunizations Administered    Name Date Dose VIS Date Route   Pfizer COVID-19 Vaccine 10/13/2019  5:15 PM 0.3 mL 07/23/2018 Intramuscular   Manufacturer: [employer], [employer]   Lot: [id number]   NDC: [address]-2

## 2020-10-07 DIAGNOSIS — R1011 Right upper quadrant pain: Secondary | ICD-10-CM | POA: Insufficient documentation

## 2020-10-07 DIAGNOSIS — J45909 Unspecified asthma, uncomplicated: Secondary | ICD-10-CM | POA: Insufficient documentation

## 2020-10-07 DIAGNOSIS — R112 Nausea with vomiting, unspecified: Secondary | ICD-10-CM | POA: Insufficient documentation

## 2020-10-08 ENCOUNTER — Other Ambulatory Visit: Payer: Self-pay

## 2020-10-08 ENCOUNTER — Emergency Department (HOSPITAL_COMMUNITY)
Admission: EM | Admit: 2020-10-08 | Discharge: 2020-10-08 | Disposition: A | Discharge status: Home / Self Care | Payer: Self-pay | Attending: Emergency Medicine | Admitting: Emergency Medicine

## 2020-10-08 ENCOUNTER — Encounter (HOSPITAL_COMMUNITY): Payer: Self-pay | Admitting: Student

## 2020-10-08 ENCOUNTER — Emergency Department (HOSPITAL_COMMUNITY): Payer: Self-pay

## 2020-10-08 DIAGNOSIS — R109 Unspecified abdominal pain: Secondary | ICD-10-CM

## 2020-10-08 LAB — COMPREHENSIVE METABOLIC PANEL
ALT: 20 U/L (ref 0–44)
AST: 21 U/L (ref 15–41)
Albumin: 4 g/dL (ref 3.5–5.0)
Alkaline Phosphatase: 72 U/L (ref 38–126)
Anion gap: 5 (ref 5–15)
BUN: 11 mg/dL (ref 6–20)
CO2: 26 mmol/L (ref 22–32)
Calcium: 9.1 mg/dL (ref 8.9–10.3)
Chloride: 105 mmol/L (ref 98–111)
Creatinine, Ser: 0.67 mg/dL (ref 0.44–1.00)
GFR, Estimated: >60 mL/min (ref >60)
Glucose, Bld: 101 mg/dL — ABNORMAL HIGH (ref 70–99)
Potassium: 4.1 mmol/L (ref 3.5–5.1)
Sodium: 136 mmol/L (ref 135–145)
Total Bilirubin: 0.2 mg/dL — ABNORMAL LOW (ref 0.3–1.2)
Total Protein: 7.6 g/dL (ref 6.5–8.1)

## 2020-10-08 LAB — I-STAT BETA HCG BLOOD, ED (MC, WL, AP ONLY): I-stat hCG, quantitative: <5 m[IU]/mL (ref <5)

## 2020-10-08 LAB — URINALYSIS, ROUTINE W REFLEX MICROSCOPIC
Bilirubin Urine: NEGATIVE
Glucose, UA: NEGATIVE mg/dL
Ketones, ur: NEGATIVE mg/dL
Nitrite: NEGATIVE
Protein, ur: 30 mg/dL — AB
Specific Gravity, Urine: 1.028 (ref 1.005–1.030)
pH: 6 (ref 5.0–8.0)

## 2020-10-08 LAB — CBC WITH DIFFERENTIAL/PLATELET
Abs Immature Granulocytes: 0.03 10*3/uL (ref 0.00–0.07)
Basophils Absolute: 0 10*3/uL (ref 0.0–0.1)
Basophils Relative: 0 %
Eosinophils Absolute: 0.1 10*3/uL (ref 0.0–0.5)
Eosinophils Relative: 1 %
HCT: 44 % (ref 36.0–46.0)
Hemoglobin: 14.5 g/dL (ref 12.0–15.0)
Immature Granulocytes: 0 %
Lymphocytes Relative: 30 %
Lymphs Abs: 3.6 10*3/uL (ref 0.7–4.0)
MCH: 29.4 pg (ref 26.0–34.0)
MCHC: 33 g/dL (ref 30.0–36.0)
MCV: 89.2 fL (ref 80.0–100.0)
Monocytes Absolute: 0.8 10*3/uL (ref 0.1–1.0)
Monocytes Relative: 7 %
Neutro Abs: 7.5 10*3/uL (ref 1.7–7.7)
Neutrophils Relative %: 62 %
Platelets: 384 10*3/uL (ref 150–400)
RBC: 4.93 MIL/uL (ref 3.87–5.11)
RDW: 13.1 % (ref 11.5–15.5)
WBC: 12.1 10*3/uL — ABNORMAL HIGH (ref 4.0–10.5)
nRBC: 0 % (ref 0.0–0.2)

## 2020-10-08 LAB — LIPASE, BLOOD: Lipase: 23 U/L (ref 11–51)

## 2020-10-08 MED ORDER — ONDANSETRON 4 MG PO TBDP
4.0000 mg | ORAL_TABLET | Freq: Once | ORAL | Status: AC
  Administered 2020-10-08: 4 mg via ORAL
  Filled 2020-10-08: qty 1

## 2020-10-08 MED ORDER — SUCRALFATE 1 GM/10ML PO SUSP: 1.0000 g | Freq: Three times a day (TID) | ORAL | 0 refills | Status: DC

## 2020-10-08 MED ORDER — OXYCODONE-ACETAMINOPHEN 5-325 MG PO TABS
1.0000 | ORAL_TABLET | Freq: Once | ORAL | Status: AC
Start: 2020-10-08 — End: 2020-10-08
  Administered 2020-10-08: 1 via ORAL
  Filled 2020-10-08: qty 1

## 2020-10-08 MED ORDER — PANTOPRAZOLE SODIUM 20 MG PO TBEC: 20.0000 mg | DELAYED_RELEASE_TABLET | Freq: Every day | ORAL | 0 refills | Status: DC

## 2020-10-08 MED ORDER — ONDANSETRON 4 MG PO TBDP: 4.0000 mg | ORAL_TABLET | Freq: Three times a day (TID) | ORAL | 0 refills | Status: DC | PRN

## 2020-10-08 NOTE — ED Triage Notes (Signed)
Pt c/o right abdominal pain last night, has had nausea and vomiting.

## 2020-10-08 NOTE — Discharge Instructions (Addendum)
You were seen in the ER today for abdominal pain.  Your work- up was overall reassuring. Your ultrasound did show some findings of fatty liver, but no acute infection of your gallbladder.   We are sending you home with the following medications to help with your symptoms:  - Protonix- please take 1 tablet in the morning prior to any meals to help with stomach acidity/pain.  - Carafate- please take prior to each meal and prior to bedtime to help with stomach acidity/pain.  - Zofran- please take every 8 hours as needed for nausea/vomiting.   We have prescribed you new medication(s) today. Discuss the medications prescribed today with your pharmacist as they can have adverse effects and interactions with your other medicines including over the counter and prescribed medications. Seek medical evaluation if you start to experience new or abnormal symptoms after taking one of these medicines, seek care immediately if you start to experience difficulty breathing, feeling of your throat closing, facial swelling, or rash as these could be indications of a more serious allergic reaction  Please follow attached diet guidelines.   Follow up with your primary care provider within 3 days for re-evaluation. We have also provided information for general surgery follow up in case gallbladder pain continues to be a problem you experience.   Return to the ER for new or worsening symptoms including but not limited to worsened pain, new pain, inability to keep fluids down, blood in vomit/stool, passing out, chest pain, trouble breathing, or any other concerns.

## 2020-10-08 NOTE — ED Provider Notes (Signed)
  Emergency Medicine Provider in Triage Note   MSE was initiated and I personally evaluated the patient  1:01 AM on Oct 08, 2020 as provider in triage.   Chief Complaint: abdominal pain  HPI  Patient is a 30 y.o. who presents to the ED with complaints of abdominal pain since last night. RUQ, constant, worse with eating. Associated nausea & 2 episodes of emesis. Hx of gallstones, has had similar less severe pain intermittently for years.    Review of Systems  Positive: Abdominal pain, nausea, vomiting Negative: Dysuria, hematemesis, diarrhea, vaginal discharge  Physical Exam  BP 132/79 (BP Location: Right Arm)   Pulse 63   Temp 98.1 F (36.7 C) (Oral)   Resp 16   SpO2 98%    Gen:   Awake, no distress   HEENT:  Atraumatic  Resp:  Normal effort  Cardiac:  Normal rate  Abd:   Nondistended, RUQ tenderness to palpation MSK:   Moves extremities without difficulty  Neuro:  Speech clear   Medical Decision Making   Initiation of care has begun. The patient has been counseled on the process, plan, and necessity for staying for the completion/evaluation, informed that the remainder of the evaluation will be completed by another provider, this initial triage assessment does not replace that evaluation, and the importance of remaining in the ED until their evaluation is complete.  Clinical Impression  Abdominal pain.         Cherly Anderson, PA-C 10/08/20 0106    Nira Conn, MD 10/11/20 2110

## 2020-10-08 NOTE — ED Provider Notes (Signed)
[facility] EMERGENCY DEPARTMENT Provider Note   CSN: [id number] Arrival date & time: 10/07/20  2349     History Chief Complaint  Patient presents with  . Abdominal Pain    Kristy Martin is a 30 y.o. female with a hx of cholelithiasis who presents to the ED with complaints of abdominal pain since last night. Patient states the pain is located in the RUQ, began after eating beef for dinner, has been constant worse after eating/ Has had associated nausea with 2 episodes of emesis. Since arrival and receiving zofran & percocet in triage pain has pretty much resolved, she has not had any further emesis. Has had similar pain intermittent for the past few years, was diagnosed with gallstones previously, tonight felt worse prompting ED visit. Denies fever, chills, diarrhea, dysuria, hematemesis, or vaginal bleeding/discharge. Denies leg pain/swelling, hemoptysis, recent surgery/trauma, recent long travel, hormone use, personal hx of cancer, or hx of DVT/PE.    HPI     Past Medical History:  Diagnosis Date  . Asthma   . Gallstones   . No pertinent past medical history     Patient Active Problem List   Diagnosis Date Noted  . Normal vaginal delivery 12/06/2010    Past Surgical History:  Procedure Laterality Date  . NO PAST SURGERIES       OB History    Gravida  1   Para  1   Term  1   Preterm  0   AB  0   Living  1     SAB  0   IAB  0   Ectopic  0   Multiple  0   Live Births  1           History reviewed. No pertinent family history.  Social History   Tobacco Use  . Smoking status: Never Smoker  . Smokeless tobacco: Never Used  Substance Use Topics  . Alcohol use: No  . Drug use: No    Home Medications Prior to Admission medications   Medication Sig Start Date End Date Taking? Authorizing Provider  acetaminophen (TYLENOL) 325 MG tablet Take 650 mg by mouth every 6 (six) hours as needed for mild pain.    [provider]  HYDROcodone-acetaminophen (NORCO/VICODIN) 5-325 MG per tablet Take 1 tablet by mouth every 6 (six) hours as needed. 10/14/14   Toy Cookey, MD  naproxen (NAPROSYN) 500 MG tablet Take 1 tablet (500 mg total) by mouth 2 (two) times daily. 12/11/16   Horton, Mayer Masker, MD  promethazine (PHENERGAN) 25 MG tablet Take 1 tablet (25 mg total) by mouth every 6 (six) hours as needed for nausea. 02/16/18   Fayrene Helper, PA-C    Allergies    Patient has no known allergies.  Review of Systems   Review of Systems  Constitutional: Negative for chills and fever.  Respiratory: Negative for shortness of breath.   Cardiovascular: Negative for chest pain.  Gastrointestinal: Positive for abdominal pain, nausea and vomiting. Negative for blood in stool, constipation and diarrhea.  Genitourinary: Negative for dysuria, vaginal bleeding and vaginal discharge.  Neurological: Negative for syncope.  All other systems reviewed and are negative.   Physical Exam Updated Vital Signs BP 132/79 (BP Location: Right Arm)   Pulse 63   Temp 98.1 F (36.7 C) (Oral)   Resp 16   SpO2 98%   Physical Exam Vitals and nursing note reviewed.  Constitutional:      General: She  is not in acute distress.    Appearance: She is well-developed. She is not toxic-appearing.  HENT:     Head: Normocephalic and atraumatic.  Eyes:     General:        Right eye: No discharge.        Left eye: No discharge.     Conjunctiva/sclera: Conjunctivae normal.  Cardiovascular:     Rate and Rhythm: Normal rate and regular rhythm.  Pulmonary:     Effort: Pulmonary effort is normal. No respiratory distress.     Breath sounds: Normal breath sounds. No wheezing, rhonchi or rales.  Abdominal:     General: There is no distension.     Palpations: Abdomen is soft.     Tenderness: There is abdominal tenderness (mild) in the right upper quadrant. There is no guarding or rebound. Negative signs include Murphy's sign and McBurney's  sign.  Musculoskeletal:     Cervical back: Neck supple.  Skin:    General: Skin is warm and dry.     Findings: No rash.  Neurological:     Mental Status: She is alert.     Comments: Clear speech.   Psychiatric:        Behavior: Behavior normal.     ED Results / Procedures / Treatments   Labs (all labs ordered are listed, but only abnormal results are displayed) Labs Reviewed  COMPREHENSIVE METABOLIC PANEL - Abnormal; Notable for the following components:      Result Value   Glucose, Bld 101 (*)    Total Bilirubin 0.2 (*)    All other components within normal limits  CBC WITH DIFFERENTIAL/PLATELET - Abnormal; Notable for the following components:   WBC 12.1 (*)    All other components within normal limits  URINALYSIS, ROUTINE W REFLEX MICROSCOPIC - Abnormal; Notable for the following components:   APPearance HAZY (*)    Hgb urine dipstick SMALL (*)    Protein, ur 30 (*)    Leukocytes,Ua TRACE (*)    Bacteria, UA RARE (*)    All other components within normal limits  LIPASE, BLOOD  I-STAT BETA HCG BLOOD, ED ([facility], [facility], [facility] ONLY)    EKG None  Radiology US Abdomen Limited RUQ (LIVER/GB)  Result Date: 10/08/2020 CLINICAL DATA:  30 year old female with abdominal pain. EXAM: ULTRASOUND ABDOMEN LIMITED RIGHT UPPER QUADRANT COMPARISON:  Ultrasound dated 10/14/2014. FINDINGS: Gallbladder: No gallstones or wall thickening visualized. No sonographic Murphy sign noted by sonographer. Common bile duct: Diameter: 6 mm Liver: There is diffuse increased liver echogenicity most commonly seen in the setting of fatty infiltration. Superimposed inflammation or fibrosis is not excluded. Clinical correlation is recommended. Portal vein is patent on color Doppler imaging with normal direction of blood flow towards the liver. Other: None. IMPRESSION: Fatty liver, otherwise unremarkable right upper quadrant ultrasound. Electronically Signed   By: Elgie Collard M.D.   On: 10/08/2020 02:05     Procedures Procedures   Medications Ordered in ED Medications  ondansetron (ZOFRAN-ODT) disintegrating tablet 4 mg (4 mg Oral Given 10/08/20 0106)  oxyCODONE-acetaminophen (PERCOCET/ROXICET) 5-325 MG per tablet 1 tablet (1 tablet Oral Given 10/08/20 0105)    ED Course  I have reviewed the triage vital signs and the nursing notes.  Pertinent labs & imaging results that were available during my care of the patient were reviewed by me and considered in my medical decision making (see chart for details).    MDM Rules/Calculators/A&P  Patient presents to the ED with complaints of abdominal pain. Patient nontoxic appearing, in no apparent distress, vitals without significant abnormality. On exam patient tender to RUQ- more so on my initial exam in triage, mild on re-exam., no peritoneal signs.   Additional history obtained:  Additional history obtained from chart review & nursing note review.   Lab Tests:  I Ordered, reviewed, and interpreted labs, which included:  CBC: Mild leukocytosis.  CMP: No significant electrolyte derangement. LFTs WNL.  Lipase: WNL UA: Trace leukocytes, rare bacteria, some contamination, no urinary sxs to raise concern for UTI.  Preg test: Negative  Imaging Studies ordered:  I ordered imaging studies which included RUQ [place], I independently reviewed, formal radiology impression shows:  Fatty liver, otherwise unremarkable right upper quadrant ultrasound.  ED Course:  On repeat abdominal exam patient remains without peritoneal signs, low suspicion for cholecystitis, pancreatitis, diverticulitis, appendicitis, bowel obstruction/perforation, ectopic pregnancy, or other acute surgical process. No complaints of vaginal discharge or concern for STI to raise concern for PID. Low risk wells- do not suspect PE.  Patient tolerating PO in the emergency department. Will discharge home with supportive measures. I discussed results, treatment plan,  need for PCP follow-up, and return precautions with the patient. Provided opportunity for questions, patient confirmed understanding and is in agreement with plan.   Portions of this note were generated with Scientist, clinical (histocompatibility and immunogenetics). Dictation errors may occur despite best attempts at proofreading.  Final Clinical Impression(s) / ED Diagnoses Final diagnoses:  Abdominal pain    Rx / DC Orders ED Discharge Orders         Ordered    sucralfate (CARAFATE) 1 GM/10ML suspension  3 times daily with meals & bedtime        10/08/20 0455    pantoprazole (PROTONIX) 20 MG tablet  Daily        10/08/20 0455    ondansetron (ZOFRAN ODT) 4 MG disintegrating tablet  Every 8 hours PRN        10/08/20 0455           Cherly Anderson, PA-C 10/08/20 0457    Nira Conn, MD 10/11/20 2109

## 2020-10-25 ENCOUNTER — Encounter (HOSPITAL_COMMUNITY): Payer: Self-pay | Admitting: *Deleted

## 2020-10-25 ENCOUNTER — Emergency Department (HOSPITAL_COMMUNITY): Payer: Self-pay

## 2020-10-25 ENCOUNTER — Emergency Department (HOSPITAL_COMMUNITY): Payer: Self-pay | Admitting: Certified Registered Nurse Anesthetist

## 2020-10-25 ENCOUNTER — Ambulatory Visit (HOSPITAL_COMMUNITY)
Admission: EM | Admit: 2020-10-25 | Discharge: 2020-10-25 | Disposition: A | Discharge status: Home / Self Care | Payer: Self-pay | Attending: Surgery | Admitting: Surgery

## 2020-10-25 ENCOUNTER — Other Ambulatory Visit: Payer: Self-pay

## 2020-10-25 ENCOUNTER — Encounter (HOSPITAL_COMMUNITY)
Admission: EM | Disposition: A | Discharge status: Home / Self Care | Payer: Self-pay | Source: Home / Self Care | Attending: Emergency Medicine

## 2020-10-25 DIAGNOSIS — K8010 Calculus of gallbladder with chronic cholecystitis without obstruction: Secondary | ICD-10-CM | POA: Insufficient documentation

## 2020-10-25 DIAGNOSIS — Z20822 Contact with and (suspected) exposure to covid-19: Secondary | ICD-10-CM | POA: Insufficient documentation

## 2020-10-25 DIAGNOSIS — K819 Cholecystitis, unspecified: Secondary | ICD-10-CM

## 2020-10-25 DIAGNOSIS — R1011 Right upper quadrant pain: Secondary | ICD-10-CM

## 2020-10-25 HISTORY — PX: CHOLECYSTECTOMY: SHX55

## 2020-10-25 LAB — CBC
HCT: 42.5 % (ref 36.0–46.0)
Hemoglobin: 13.7 g/dL (ref 12.0–15.0)
MCH: 29.1 pg (ref 26.0–34.0)
MCHC: 32.2 g/dL (ref 30.0–36.0)
MCV: 90.2 fL (ref 80.0–100.0)
Platelets: 333 10*3/uL (ref 150–400)
RBC: 4.71 MIL/uL (ref 3.87–5.11)
RDW: 13.1 % (ref 11.5–15.5)
WBC: 9.6 10*3/uL (ref 4.0–10.5)
nRBC: 0 % (ref 0.0–0.2)

## 2020-10-25 LAB — RESP PANEL BY RT-PCR (FLU A&B, COVID) ARPGX2
Influenza A by PCR: NEGATIVE
Influenza B by PCR: NEGATIVE
SARS Coronavirus 2 by RT PCR: NEGATIVE

## 2020-10-25 LAB — COMPREHENSIVE METABOLIC PANEL
ALT: 24 U/L (ref 0–44)
AST: 25 U/L (ref 15–41)
Albumin: 3.8 g/dL (ref 3.5–5.0)
Alkaline Phosphatase: 79 U/L (ref 38–126)
Anion gap: 7 (ref 5–15)
BUN: 14 mg/dL (ref 6–20)
CO2: 24 mmol/L (ref 22–32)
Calcium: 9.1 mg/dL (ref 8.9–10.3)
Chloride: 105 mmol/L (ref 98–111)
Creatinine, Ser: 0.63 mg/dL (ref 0.44–1.00)
GFR, Estimated: >60 mL/min (ref >60)
Glucose, Bld: 116 mg/dL — ABNORMAL HIGH (ref 70–99)
Potassium: 4 mmol/L (ref 3.5–5.1)
Sodium: 136 mmol/L (ref 135–145)
Total Bilirubin: 0.4 mg/dL (ref 0.3–1.2)
Total Protein: 7.1 g/dL (ref 6.5–8.1)

## 2020-10-25 LAB — URINALYSIS, ROUTINE W REFLEX MICROSCOPIC
Bilirubin Urine: NEGATIVE
Glucose, UA: NEGATIVE mg/dL
Ketones, ur: NEGATIVE mg/dL
Nitrite: NEGATIVE
Protein, ur: NEGATIVE mg/dL
Specific Gravity, Urine: 1.029 (ref 1.005–1.030)
WBC, UA: >50 WBC/hpf — ABNORMAL HIGH (ref 0–5)
pH: 5 (ref 5.0–8.0)

## 2020-10-25 LAB — I-STAT BETA HCG BLOOD, ED (MC, WL, AP ONLY): I-stat hCG, quantitative: <5 m[IU]/mL (ref <5)

## 2020-10-25 LAB — LIPASE, BLOOD: Lipase: 26 U/L (ref 11–51)

## 2020-10-25 SURGERY — LAPAROSCOPIC CHOLECYSTECTOMY WITH INTRAOPERATIVE CHOLANGIOGRAM
Anesthesia: General

## 2020-10-25 MED ORDER — DEXAMETHASONE SODIUM PHOSPHATE 10 MG/ML IJ SOLN
INTRAMUSCULAR | Status: DC | PRN
  Administered 2020-10-25: 10 mg via INTRAVENOUS

## 2020-10-25 MED ORDER — ACETAMINOPHEN 650 MG RE SUPP: 650.0000 mg | RECTAL | Status: DC | PRN

## 2020-10-25 MED ORDER — PROPOFOL 10 MG/ML IV BOLUS
INTRAVENOUS | Status: AC
  Filled 2020-10-25: qty 20

## 2020-10-25 MED ORDER — MORPHINE SULFATE (PF) 4 MG/ML IV SOLN
4.0000 mg | Freq: Once | INTRAVENOUS | Status: AC
  Administered 2020-10-25: 4 mg via INTRAVENOUS
  Filled 2020-10-25: qty 1

## 2020-10-25 MED ORDER — MIDAZOLAM HCL 2 MG/2ML IJ SOLN
INTRAMUSCULAR | Status: DC | PRN
  Administered 2020-10-25: 2 mg via INTRAVENOUS

## 2020-10-25 MED ORDER — ACETAMINOPHEN 325 MG PO TABS: 650.0000 mg | ORAL_TABLET | ORAL | Status: DC | PRN

## 2020-10-25 MED ORDER — TRAMADOL HCL 50 MG PO TABS: 50.0000 mg | ORAL_TABLET | Freq: Four times a day (QID) | ORAL | 0 refills | Status: AC | PRN

## 2020-10-25 MED ORDER — ROCURONIUM BROMIDE 10 MG/ML (PF) SYRINGE
PREFILLED_SYRINGE | INTRAVENOUS | Status: DC | PRN
  Administered 2020-10-25: 70 mg via INTRAVENOUS

## 2020-10-25 MED ORDER — MORPHINE SULFATE (PF) 2 MG/ML IV SOLN: 1.0000 mg | INTRAVENOUS | Status: DC | PRN

## 2020-10-25 MED ORDER — BUPIVACAINE HCL 0.5 % IJ SOLN
INTRAMUSCULAR | Status: AC
  Filled 2020-10-25: qty 1

## 2020-10-25 MED ORDER — PROPOFOL 10 MG/ML IV BOLUS
INTRAVENOUS | Status: DC | PRN
  Administered 2020-10-25: 170 mg via INTRAVENOUS

## 2020-10-25 MED ORDER — OXYCODONE HCL 5 MG PO TABS: 5.0000 mg | ORAL_TABLET | ORAL | Status: DC | PRN

## 2020-10-25 MED ORDER — FENTANYL CITRATE (PF) 100 MCG/2ML IJ SOLN: 25.0000 ug | INTRAMUSCULAR | Status: DC | PRN

## 2020-10-25 MED ORDER — ONDANSETRON HCL 4 MG/2ML IJ SOLN
4.0000 mg | Freq: Once | INTRAMUSCULAR | Status: AC
  Administered 2020-10-25: 4 mg via INTRAVENOUS
  Filled 2020-10-25: qty 2

## 2020-10-25 MED ORDER — BUPIVACAINE HCL (PF) 0.25 % IJ SOLN
INTRAMUSCULAR | Status: AC
  Filled 2020-10-25: qty 30

## 2020-10-25 MED ORDER — SODIUM CHLORIDE 0.9 % IV SOLN
1.0000 g | Freq: Once | INTRAVENOUS | Status: AC
  Administered 2020-10-25: 1 g via INTRAVENOUS
  Filled 2020-10-25: qty 10

## 2020-10-25 MED ORDER — SODIUM CHLORIDE 0.9 % IR SOLN
Status: DC | PRN
  Administered 2020-10-25: 1000 mL

## 2020-10-25 MED ORDER — FENTANYL CITRATE (PF) 250 MCG/5ML IJ SOLN
INTRAMUSCULAR | Status: DC | PRN
  Administered 2020-10-25: 100 ug via INTRAVENOUS
  Administered 2020-10-25: 50 ug via INTRAVENOUS

## 2020-10-25 MED ORDER — ONDANSETRON HCL 4 MG/2ML IJ SOLN
INTRAMUSCULAR | Status: DC | PRN
  Administered 2020-10-25: 4 mg via INTRAVENOUS

## 2020-10-25 MED ORDER — MIDAZOLAM HCL 2 MG/2ML IJ SOLN
INTRAMUSCULAR | Status: AC
  Filled 2020-10-25: qty 2

## 2020-10-25 MED ORDER — LIDOCAINE 2% (20 MG/ML) 5 ML SYRINGE
INTRAMUSCULAR | Status: DC | PRN
  Administered 2020-10-25: 80 mg via INTRAVENOUS

## 2020-10-25 MED ORDER — SODIUM CHLORIDE 0.9% FLUSH: 3.0000 mL | Freq: Two times a day (BID) | INTRAVENOUS | Status: DC

## 2020-10-25 MED ORDER — SODIUM CHLORIDE 0.9% FLUSH: 3.0000 mL | INTRAVENOUS | Status: DC | PRN

## 2020-10-25 MED ORDER — SODIUM CHLORIDE 0.9 % IV SOLN: INTRAVENOUS | Status: DC

## 2020-10-25 MED ORDER — SODIUM CHLORIDE 0.9 % IV BOLUS
500.0000 mL | Freq: Once | INTRAVENOUS | Status: AC
  Administered 2020-10-25: 500 mL via INTRAVENOUS

## 2020-10-25 MED ORDER — ORAL CARE MOUTH RINSE: 15.0000 mL | Freq: Once | OROMUCOSAL | Status: AC

## 2020-10-25 MED ORDER — SODIUM CHLORIDE 0.9 % IV SOLN: 250.0000 mL | INTRAVENOUS | Status: DC | PRN

## 2020-10-25 MED ORDER — 0.9 % SODIUM CHLORIDE (POUR BTL) OPTIME
TOPICAL | Status: DC | PRN
  Administered 2020-10-25: 1000 mL

## 2020-10-25 MED ORDER — FENTANYL CITRATE (PF) 250 MCG/5ML IJ SOLN
INTRAMUSCULAR | Status: AC
  Filled 2020-10-25: qty 5

## 2020-10-25 MED ORDER — CHLORHEXIDINE GLUCONATE 0.12 % MT SOLN
15.0000 mL | Freq: Once | OROMUCOSAL | Status: AC
  Administered 2020-10-25: 15 mL via OROMUCOSAL
  Filled 2020-10-25: qty 15

## 2020-10-25 MED ORDER — LACTATED RINGERS IV SOLN: INTRAVENOUS | Status: DC

## 2020-10-25 MED ORDER — ONDANSETRON HCL 4 MG/2ML IJ SOLN: 4.0000 mg | INTRAMUSCULAR | Status: DC | PRN

## 2020-10-25 MED ORDER — BUPIVACAINE HCL (PF) 0.25 % IJ SOLN
INTRAMUSCULAR | Status: DC | PRN
  Administered 2020-10-25: 24 mL

## 2020-10-25 MED ORDER — OXYCODONE HCL 5 MG PO TABS
ORAL_TABLET | ORAL | Status: AC
  Filled 2020-10-25: qty 1

## 2020-10-25 MED ORDER — SUGAMMADEX SODIUM 200 MG/2ML IV SOLN
INTRAVENOUS | Status: DC | PRN
  Administered 2020-10-25: 200 mg via INTRAVENOUS

## 2020-10-25 SURGICAL SUPPLY — 37 items
APPLIER CLIP 5 13 M/L LIGAMAX5 (MISCELLANEOUS) ×3
CANISTER SUCT 3000ML PPV (MISCELLANEOUS) ×3 IMPLANT
CHLORAPREP W/TINT 26 (MISCELLANEOUS) ×3 IMPLANT
CLIP APPLIE 5 13 M/L LIGAMAX5 (MISCELLANEOUS) ×1 IMPLANT
CNTNR URN SCR LID CUP LEK RST (MISCELLANEOUS) ×1 IMPLANT
CONT SPEC 4OZ STRL OR WHT (MISCELLANEOUS) ×3
COVER MAYO STAND STRL (DRAPES) IMPLANT
COVER SURGICAL LIGHT HANDLE (MISCELLANEOUS) ×3 IMPLANT
COVER WAND RF STERILE (DRAPES) ×3 IMPLANT
DERMABOND ADVANCED (GAUZE/BANDAGES/DRESSINGS) ×2
DERMABOND ADVANCED .7 DNX12 (GAUZE/BANDAGES/DRESSINGS) ×1 IMPLANT
DRAPE C-ARM 42X120 X-RAY (DRAPES) IMPLANT
ELECT REM PT RETURN 9FT ADLT (ELECTROSURGICAL) ×3
ELECTRODE REM PT RTRN 9FT ADLT (ELECTROSURGICAL) ×1 IMPLANT
GLOVE BIO SURGEON STRL SZ 6 (GLOVE) ×3 IMPLANT
GLOVE SURG UNDER LTX SZ6.5 (GLOVE) ×3 IMPLANT
GOWN STRL REUS W/ TWL LRG LVL3 (GOWN DISPOSABLE) ×3 IMPLANT
GOWN STRL REUS W/TWL LRG LVL3 (GOWN DISPOSABLE) ×9
GRASPER SUT TROCAR 14GX15 (MISCELLANEOUS) ×3 IMPLANT
KIT BASIN OR (CUSTOM PROCEDURE TRAY) ×3 IMPLANT
KIT TURNOVER KIT B (KITS) ×3 IMPLANT
NEEDLE INSUFFLATION 14GA 120MM (NEEDLE) ×3 IMPLANT
NS IRRIG 1000ML POUR BTL (IV SOLUTION) ×3 IMPLANT
PAD ARMBOARD 7.5X6 YLW CONV (MISCELLANEOUS) ×3 IMPLANT
POUCH SPECIMEN RETRIEVAL 10MM (ENDOMECHANICALS) ×3 IMPLANT
SCISSORS LAP 5X35 DISP (ENDOMECHANICALS) ×3 IMPLANT
SET CHOLANGIOGRAPH 5 50 .035 (SET/KITS/TRAYS/PACK) IMPLANT
SET IRRIG TUBING LAPAROSCOPIC (IRRIGATION / IRRIGATOR) ×3 IMPLANT
SET TUBE SMOKE EVAC HIGH FLOW (TUBING) ×3 IMPLANT
SLEEVE ENDOPATH XCEL 5M (ENDOMECHANICALS) ×6 IMPLANT
SUT MNCRL AB 4-0 PS2 18 (SUTURE) ×3 IMPLANT
TOWEL GREEN STERILE (TOWEL DISPOSABLE) ×3 IMPLANT
TOWEL GREEN STERILE FF (TOWEL DISPOSABLE) ×3 IMPLANT
TRAY LAPAROSCOPIC MC (CUSTOM PROCEDURE TRAY) ×3 IMPLANT
TROCAR XCEL NON-BLD 11X100MML (ENDOMECHANICALS) ×3 IMPLANT
TROCAR XCEL NON-BLD 5MMX100MML (ENDOMECHANICALS) ×3 IMPLANT
WATER STERILE IRR 1000ML POUR (IV SOLUTION) ×3 IMPLANT

## 2020-10-25 NOTE — Transfer of Care (Signed)
Immediate Anesthesia Transfer of Care Note  Patient: Kristy Martin  Procedure(s) Performed: LAPAROSCOPIC CHOLECYSTECTOMY (N/A )  Patient Location: PACU  Anesthesia Type:General  Level of Consciousness: drowsy  Airway & Oxygen Therapy: Patient Spontanous Breathing and Patient connected to face mask oxygen  Post-op Assessment: Report given to RN and Post -op Vital signs reviewed and stable  Post vital signs: Reviewed and stable  Last Vitals:  Vitals Value Taken Time  BP 123/74 10/25/20 1338  Temp    Pulse 67 10/25/20 1338  Resp 9 10/25/20 1338  SpO2 98 % 10/25/20 1338  Vitals shown include unvalidated device data.  Last Pain:  Vitals:   10/25/20 1136  TempSrc:   PainSc: 0-No pain      Patients Stated Pain Goal: 3 (10/25/20 1136)  Complications: No complications documented.

## 2020-10-25 NOTE — Anesthesia Procedure Notes (Signed)
Procedure Name: Intubation Date/Time: 10/25/2020 12:24 PM Performed by: Candis Shine, CRNA Pre-anesthesia Checklist: Patient identified, Emergency Drugs available, Suction available and Patient being monitored Patient Re-evaluated:Patient Re-evaluated prior to induction Oxygen Delivery Method: Circle System Utilized Preoxygenation: Pre-oxygenation with 100% oxygen Induction Type: IV induction Ventilation: Mask ventilation without difficulty Laryngoscope Size: Mac and 3 Grade View: Grade I Tube type: Oral Tube size: 7.0 mm Number of attempts: 1 Airway Equipment and Method: Stylet Placement Confirmation: ETT inserted through vocal cords under direct vision,  positive ETCO2 and breath sounds checked- equal and bilateral Secured at: 22 cm Tube secured with: Tape Dental Injury: Teeth and Oropharynx as per pre-operative assessment

## 2020-10-25 NOTE — Anesthesia Postprocedure Evaluation (Signed)
Anesthesia Post Note  Patient: Kristy Martin  Procedure(s) Performed: LAPAROSCOPIC CHOLECYSTECTOMY (N/A )     Patient location during evaluation: PACU Anesthesia Type: General Level of consciousness: awake and alert Pain management: pain level controlled Vital Signs Assessment: post-procedure vital signs reviewed and stable Respiratory status: spontaneous breathing, nonlabored ventilation, respiratory function stable and patient connected to nasal cannula oxygen Cardiovascular status: blood pressure returned to baseline and stable Postop Assessment: no apparent nausea or vomiting Anesthetic complications: no   No complications documented.  Last Vitals:  Vitals:   10/25/20 1410 10/25/20 1425  BP: 124/82 128/88  Pulse: (!) 56 60  Resp: (!) 9 10  Temp: 36.5 C   SpO2: 98% 98%    Last Pain:  Vitals:   10/25/20 1410  TempSrc:   PainSc: 5                  Cecile Hearing

## 2020-10-25 NOTE — ED Provider Notes (Signed)
[facility] EMERGENCY DEPARTMENT Provider Note   CSN: [id number] Arrival date & time: 10/25/20  0309     History Chief Complaint  Patient presents with  . Abdominal Pain    Kristy Martin is a 30 y.o. female presents to the Emergency Department complaining of gradual, persistent, progressively worsening right upper quadrant abdominal pain onset approximately 8 hours ago.  Patient with associated nausea and vomiting.  She reports a history of gallstones but ever followed up with general surgery.  She reports she had an episode several weeks ago and was told that she had a normal ultrasound at that time.  Patient reports pain is severe, rated at a 10/10 and described as sharp and stabbing.  Nothing seems to make it better or worse.  Patient denies possibility of pregnancy.  No known trauma.  Denies fever, chills, weakness, dizziness, syncope, diarrhea.  No known sick contacts.   The history is provided by the patient and medical records. No language interpreter was used.       Past Medical History:  Diagnosis Date  . Asthma   . Gallstones   . No pertinent past medical history     Patient Active Problem List   Diagnosis Date Noted  . Normal vaginal delivery 12/06/2010    Past Surgical History:  Procedure Laterality Date  . NO PAST SURGERIES       OB History    Gravida  1   Para  1   Term  1   Preterm  0   AB  0   Living  1     SAB  0   IAB  0   Ectopic  0   Multiple  0   Live Births  1           History reviewed. No pertinent family history.  Social History   Tobacco Use  . Smoking status: Never Smoker  . Smokeless tobacco: Never Used  Substance Use Topics  . Alcohol use: No  . Drug use: No    Home Medications Prior to Admission medications   Medication Sig Start Date End Date Taking? Authorizing Provider  ibuprofen (ADVIL) 200 MG tablet Take 400 mg by mouth every 6 (six) hours as needed for headache or  moderate pain.   Yes [provider]  naproxen (NAPROSYN) 500 MG tablet Take 1 tablet (500 mg total) by mouth 2 (two) times daily. Patient not taking: Reported on 10/25/2020 12/11/16   Horton, Mayer Masker, MD  ondansetron (ZOFRAN ODT) 4 MG disintegrating tablet Take 1 tablet (4 mg total) by mouth every 8 (eight) hours as needed for nausea or vomiting. Patient not taking: Reported on 10/25/2020 10/08/20   Petrucelli, Pleas Koch, PA-C  pantoprazole (PROTONIX) 20 MG tablet Take 1 tablet (20 mg total) by mouth daily. Patient not taking: Reported on 10/25/2020 10/08/20   Petrucelli, Pleas Koch, PA-C  promethazine (PHENERGAN) 25 MG tablet Take 1 tablet (25 mg total) by mouth every 6 (six) hours as needed for nausea. Patient not taking: Reported on 10/25/2020 02/16/18   Fayrene Helper, PA-C  sucralfate (CARAFATE) 1 GM/10ML suspension Take 10 mLs (1 g total) by mouth 4 (four) times daily -  with meals and at bedtime. Patient not taking: Reported on 10/25/2020 10/08/20   Petrucelli, Pleas Koch, PA-C    Allergies    Patient has no known allergies.  Review of Systems   Review of Systems  Constitutional: Negative for appetite change, diaphoresis, fatigue,  fever and unexpected weight change.  HENT: Negative for mouth sores.   Eyes: Negative for visual disturbance.  Respiratory: Negative for cough, chest tightness, shortness of breath and wheezing.   Cardiovascular: Negative for chest pain.  Gastrointestinal: Positive for abdominal pain, nausea and vomiting. Negative for constipation and diarrhea.  Endocrine: Negative for polydipsia, polyphagia and polyuria.  Genitourinary: Negative for dysuria, frequency, hematuria and urgency.  Musculoskeletal: Negative for back pain and neck stiffness.  Skin: Negative for rash.  Allergic/Immunologic: Negative for immunocompromised state.  Neurological: Negative for syncope, light-headedness and headaches.  Hematological: Does not bruise/bleed easily.   Psychiatric/Behavioral: Negative for sleep disturbance. The patient is not nervous/anxious.     Physical Exam Updated Vital Signs BP 112/72 (BP Location: Left Arm)   Pulse (!) 57   Temp 98.4 F (36.9 C) (Oral)   Resp 18   Ht 5\' 1"  (1.549 m)   Wt 76.2 kg   LMP 10/22/2020   SpO2 99%   BMI 31.74 kg/m   Physical Exam Vitals and nursing note reviewed.  Constitutional:      General: She is not in acute distress.    Appearance: She is not diaphoretic.  HENT:     Head: Normocephalic.  Eyes:     General: No scleral icterus.    Conjunctiva/sclera: Conjunctivae normal.  Cardiovascular:     Rate and Rhythm: Normal rate and regular rhythm.     Pulses: Normal pulses.          Radial pulses are 2+ on the right side and 2+ on the left side.     Heart sounds: Normal heart sounds.  Pulmonary:     Effort: No tachypnea, accessory muscle usage, prolonged expiration, respiratory distress or retractions.     Breath sounds: No stridor.     Comments: Equal chest rise. No increased work of breathing. Abdominal:     General: There is no distension.     Palpations: Abdomen is soft.     Tenderness: There is abdominal tenderness in the right upper quadrant. There is no guarding or rebound. Positive signs include Murphy's sign.  Musculoskeletal:     Cervical back: Normal range of motion.     Comments: Moves all extremities equally and without difficulty.  Skin:    General: Skin is warm and dry.     Capillary Refill: Capillary refill takes less than 2 seconds.  Neurological:     Mental Status: She is alert.     GCS: GCS eye subscore is 4. GCS verbal subscore is 5. GCS motor subscore is 6.     Comments: Speech is clear and goal oriented.  Psychiatric:        Mood and Affect: Mood normal.     ED Results / Procedures / Treatments   Labs (all labs ordered are listed, but only abnormal results are displayed) Labs Reviewed  COMPREHENSIVE METABOLIC PANEL - Abnormal; Notable for the following  components:      Result Value   Glucose, Bld 116 (*)    All other components within normal limits  URINALYSIS, ROUTINE W REFLEX MICROSCOPIC - Abnormal; Notable for the following components:   APPearance CLOUDY (*)    Hgb urine dipstick MODERATE (*)    Leukocytes,Ua LARGE (*)    WBC, UA >50 (*)    Bacteria, UA FEW (*)    All other components within normal limits  URINE CULTURE  LIPASE, BLOOD  CBC  I-STAT BETA HCG BLOOD, ED ([facility], [facility], [facility] ONLY)  EKG None  Radiology US Abdomen Limited  Result Date: 10/25/2020 CLINICAL DATA:  Right upper quadrant abdominal pain EXAM: ULTRASOUND ABDOMEN LIMITED RIGHT UPPER QUADRANT COMPARISON:  None. FINDINGS: Gallbladder: 1.3 cm gallstone in the gallbladder neck. Associated gallbladder sludge. Very mild gallbladder wall thickening without pericholecystic fluid or gallbladder distension. Negative sonographic Murphy's sign. Common bile duct: Diameter: 10 mm Liver: Hyperechoic hepatic parenchyma, suggesting hepatic steatosis, focal fatty sparing along the gallbladder fossa. portal vein is patent on color Doppler imaging with normal direction of blood flow towards the liver. Other: None. IMPRESSION: Cholelithiasis, without associated sonographic findings to suggest acute cholecystitis. Dilated common duct, measuring 10 mm. Correlate with LFTs; if abnormal, consider ERCP or MRCP to exclude choledocholithiasis. Suspected hepatic steatosis with focal fatty sparing. Electronically Signed   By: Charline Bills M.D.   On: 10/25/2020 06:36    Procedures Procedures   Medications Ordered in ED Medications  cefTRIAXone (ROCEPHIN) 1 g in sodium chloride 0.9 % 100 mL IVPB (has no administration in time range)  ondansetron (ZOFRAN) injection 4 mg (4 mg Intravenous Given 10/25/20 0525)  morphine 4 MG/ML injection 4 mg (4 mg Intravenous Given 10/25/20 0526)  sodium chloride 0.9 % bolus 500 mL (500 mLs Intravenous New Bag/Given 10/25/20 0524)    ED Course  I have  reviewed the triage vital signs and the nursing notes.  Pertinent labs & imaging results that were available during my care of the patient were reviewed by me and considered in my medical decision making (see chart for details).    MDM Rules/Calculators/A&P                          Patient presents with right upper quadrant abdominal pain, nausea and vomiting.  Concern for cholecystitis, cholelithiasis, choledocholithiasis.  Pain control given.  6:18 AM Pain improved at this time.  No persistent vomiting.  Labs are reassuring without leukocytosis.  No elevation in AST/ALT or lipase.  Urinalysis is concerning for urinary tract infection.  Patient's right upper quadrant abdominal pain may be secondary to pyelonephritis.  Will treat with Rocephin.  6:48 AM With cholelithiasis and dilated common bile duct.  Patient will need GI/surgery consult.  At shift change care was transferred to [facility] who will follow pending studies, re-evaulate and determine disposition.      Final Clinical Impression(s) / ED Diagnoses Final diagnoses:  RUQ abdominal pain    Rx / DC Orders ED Discharge Orders    None       Kristy Martin, Boyd Kerbs 10/25/20 2778    Palumbo, April, MD 10/25/20 [phone]

## 2020-10-25 NOTE — Anesthesia Preprocedure Evaluation (Signed)
Anesthesia Evaluation  Patient identified by MRN, date of birth, ID band Patient awake    Reviewed: Allergy & Precautions, NPO status , Patient's Chart, lab work & pertinent test results  Airway Mallampati: II  TM Distance: >3 FB Neck ROM: Full    Dental  (+) Teeth Intact, Dental Advisory Given   Pulmonary asthma ,    Pulmonary exam normal breath sounds clear to auscultation       Cardiovascular negative cardio ROS Normal cardiovascular exam Rhythm:Regular Rate:Normal     Neuro/Psych negative neurological ROS     GI/Hepatic GERD  Medicated,Cholecystitis   Endo/Other  Obesity   Renal/GU negative Renal ROS     Musculoskeletal negative musculoskeletal ROS (+)   Abdominal   Peds  Hematology negative hematology ROS (+)   Anesthesia Other Findings Day of surgery medications reviewed with the patient.  Reproductive/Obstetrics negative OB ROS                             Anesthesia Physical Anesthesia Plan  ASA: II  Anesthesia Plan: General   Post-op Pain Management:    Induction: Intravenous  PONV Risk Score and Plan: 4 or greater and Diphenhydramine, Midazolam, Dexamethasone and Ondansetron  Airway Management Planned: Oral ETT  Additional Equipment:   Intra-op Plan:   Post-operative Plan: Extubation in OR  Informed Consent: I have reviewed the patients History and Physical, chart, labs and discussed the procedure including the risks, benefits and alternatives for the proposed anesthesia with the patient or authorized representative who has indicated his/her understanding and acceptance.     Dental advisory given  Plan Discussed with: CRNA  Anesthesia Plan Comments:         Anesthesia Quick Evaluation

## 2020-10-25 NOTE — ED Provider Notes (Signed)
Patient taken in sign out from PA Muthersbaugh. She presents emergency department with gradually progressively worsening but persistent right upper quadrant abdominal pain, nausea and vomiting.  Normal liver enzymes, positive UTI treated with Rocephin.  Patient's ultrasound shows cholelithiasis with dilated CBD concerning for choledocholithiasis.  Plan to consult with GI.   Case discussed with Dr. Myrtie Neither (GI) recommends surgical consult, lap chole and IOC. Case discussed w/ PA Earl Gala of surgery who will consult for admission   Arthor Captain, Cordelia Poche 10/25/20 0827    Pricilla Loveless, MD 10/30/20 0040

## 2020-10-25 NOTE — ED Notes (Signed)
Handoff given to Short Stay nurse.

## 2020-10-25 NOTE — Op Note (Signed)
Operative Note  Kristy Martin 30 y.o. female [id number]  10/25/2020  Surgeon: Berna Bue MD FACS  Assistant: Berenda Morale RNFA  Procedure performed: Laparoscopic Cholecystectomy  Procedure classification: urgent  Preop diagnosis: acute cholecystitis Post-op diagnosis/intraop findings: same  Specimens: gallbladder  Retained items: none  EBL: minimal  Complications: none  Description of procedure: After obtaining informed consent the patient was brought to the operating room. Antibiotics were administered. SCD's were applied. General endotracheal anesthesia was initiated and a formal time-out was performed. The abdomen was prepped and draped in the usual sterile fashion and the abdomen was entered using an infraumbilical veress needle after instilling the site with local. Insufflation to was obtained, 83mm trocar and camera inserted, and gross inspection revealed no evidence of injury from our entry or other intraabdominal abnormalities. Two 77mm trocars were introduced in the right midclavicular and right anterior axillary lines under direct visualization and following infiltration with local. An 41mm trocar was placed in the epigastrium. The gallbladder was retracted cephalad and the infundibulum was retracted laterally.  The gallbladder is acutely and chronically inflamed.  A combination of hook electrocautery and blunt dissection was utilized to clear the peritoneum from the neck and cystic duct, circumferentially isolating the cystic artery and cystic duct and lifting the gallbladder from the cystic plate. The critical view of safety was achieved with the cystic artery, cystic duct, and liver bed visualized between them with no other structures. The artery was clipped with a 2 clips proximally and one distally and divided as was the cystic duct with three clips on the proximal end. The gallbladder was dissected from the liver plate using electrocautery.  An  additional posterior cystic artery branch was clipped proximally before being divided distally with cautery. Once freed the gallbladder was placed in an endocatch bag and removed intact through the epigastric trocar site.  The right upper quadrant was irrigated and aspirated, the effluent was clear. Hemostasis was once again confirmed, and reinspection of the abdomen revealed no injuries. The clips were well opposed without any bile leak from the duct or the liver bed. The 53mm trocar site in the epigastrium was closed with a 0 vicryl in the fascia under direct visualization using a PMI device. The abdomen was desufflated and all trocars removed. The skin incisions were closed with 4-0 subcuticular monocryl and Dermabond. The patient was awakened, extubated and transported to the recovery room in stable condition.    All counts were correct at the completion of the case.

## 2020-10-25 NOTE — Discharge Instructions (Signed)
[facility]  Please arrive at least 30 min before your appointment to complete your check in paperwork.  If you are unable to arrive 30 min prior to your appointment time we may have to cancel or reschedule you. LAPAROSCOPIC SURGERY: POST OP INSTRUCTIONS Always review your discharge instruction sheet given to you by the facility where your surgery was performed. IF YOU HAVE DISABILITY OR FAMILY LEAVE FORMS, YOU MUST BRING THEM TO THE OFFICE FOR PROCESSING.   DO NOT GIVE THEM TO YOUR DOCTOR.  PAIN CONTROL  1. First take acetaminophen (Tylenol) AND/or ibuprofen (Advil) to control your pain after surgery.  Follow directions on package.  Taking acetaminophen (Tylenol) and/or ibuprofen (Advil) regularly after surgery will help to control your pain and lower the amount of prescription pain medication you may need.  You should not take more than 4,000 mg (4 grams) of acetaminophen (Tylenol) in 24 hours.  You should not take ibuprofen (Advil), aleve, motrin, naprosyn or other NSAIDS if you have a history of stomach ulcers or chronic kidney disease.  2. A prescription for pain medication may be given to you upon discharge.  Take your pain medication as prescribed, if you still have uncontrolled pain after taking acetaminophen (Tylenol) or ibuprofen (Advil). 3. Use ice packs to help control pain. 4. If you need a refill on your pain medication, please contact your pharmacy.  They will contact our office to request authorization. Prescriptions will not be filled after 5pm or on week-ends.  HOME MEDICATIONS 5. Take your usually prescribed medications unless otherwise directed.  DIET 6. You should follow a light diet the first few days after arrival home.  Be sure to include lots of fluids daily. Avoid fatty, fried foods.   CONSTIPATION 7. It is common to experience some constipation after surgery and if you are taking pain medication.  Increasing fluid intake and taking a stool  softener (such as Colace) will usually help or prevent this problem from occurring.  A mild laxative (Milk of Magnesia or Miralax) should be taken according to package instructions if there are no bowel movements after 48 hours.  WOUND/INCISION CARE 8. Most patients will experience some swelling and bruising in the area of the incisions.  Ice packs will help.  Swelling and bruising can take several days to resolve.  9. Unless discharge instructions indicate otherwise, follow guidelines below  a. STERI-STRIPS - you may remove your outer bandages 48 hours after surgery, and you may shower at that time.  You have steri-strips (small skin tapes) in place directly over the incision.  These strips should be left on the skin for 7-10 days.   b. DERMABOND/SKIN GLUE - you may shower in 24 hours.  The glue will flake off over the next 2-3 weeks. 10. Any sutures or staples will be removed at the office during your follow-up visit.  ACTIVITIES 11. You may resume regular (light) daily activities beginning the next day--such as daily self-care, walking, climbing stairs--gradually increasing activities as tolerated.  You may have sexual intercourse when it is comfortable.  Refrain from any heavy lifting or straining until approved by your doctor. a. You may drive when you are no longer taking prescription pain medication, you can comfortably wear a seatbelt, and you can safely maneuver your car and apply brakes.  FOLLOW-UP 12. You should see your doctor in the office for a follow-up appointment approximately 2-3 weeks after your surgery.  You should have been given your post-op/follow-up appointment when   your surgery was scheduled.  If you did not receive a post-op/follow-up appointment, make sure that you call for this appointment within a day or two after you arrive home to insure a convenient appointment time.   WHEN TO CALL YOUR DOCTOR: 1. Fever over 101.0 2. Inability to urinate 3. Continued bleeding from  incision. 4. Increased pain, redness, or drainage from the incision. 5. Increasing abdominal pain  The clinic staff is available to answer your questions during regular business hours.  Please don't hesitate to call and ask to speak to one of the nurses for clinical concerns.  If you have a medical emergency, go to the nearest emergency room or call 911.  A surgeon from [facility] is always on call at the hospital. [address], [address], [place], [place]  [address] ? [address] [address], [place], [place]   [address] [phone] ? [phone] ? FAX [phone]  .........   Managing Your Pain After Surgery Without Opioids    Thank you for participating in our program to help patients manage their pain after surgery without opioids. This is part of our effort to provide you with the best care possible, without exposing you or your family to the risk that opioids pose.  What pain can I expect after surgery? You can expect to have some pain after surgery. This is normal. The pain is typically worse the day after surgery, and quickly begins to get better. Many studies have found that many patients are able to manage their pain after surgery with Over-the-Counter (OTC) medications such as Tylenol and Motrin. If you have a condition that does not allow you to take Tylenol or Motrin, notify your surgical team.  How will I manage my pain? The best strategy for controlling your pain after surgery is around the clock pain control with Tylenol (acetaminophen) and Motrin (ibuprofen or Advil). Alternating these medications with each other allows you to maximize your pain control. In addition to Tylenol and Motrin, you can use heating pads or ice packs on your incisions to help reduce your pain.  How will I alternate your regular strength over-the-counter pain medication? You will take a dose of pain medication every three hours. ; Start by taking 650 mg of Tylenol (2 pills of 325  mg) ; 3 hours later take 600 mg of Motrin (3 pills of 200 mg) ; 3 hours after taking the Motrin take 650 mg of Tylenol ; 3 hours after that take 600 mg of Motrin.   - 1 -  See example - if your first dose of Tylenol is at 12:00 PM   12:00 PM Tylenol 650 mg (2 pills of 325 mg)  3:00 PM Motrin 600 mg (3 pills of 200 mg)  6:00 PM Tylenol 650 mg (2 pills of 325 mg)  9:00 PM Motrin 600 mg (3 pills of 200 mg)  Continue alternating every 3 hours   We recommend that you follow this schedule around-the-clock for at least 3 days after surgery, or until you feel that it is no longer needed. Use the table on the last page of this handout to keep track of the medications you are taking. Important: Do not take more than 3000mg of Tylenol or 3200mg of Motrin in a 24-hour period. Do not take ibuprofen/Motrin if you have a history of bleeding stomach ulcers, severe kidney disease, &/or actively taking a blood thinner  What if I still have pain? If you have pain that is not   controlled with the over-the-counter pain medications (Tylenol and Motrin or Advil) you might have what we call "breakthrough" pain. You will receive a prescription for a small amount of an opioid pain medication such as Oxycodone, Tramadol, or Tylenol with Codeine. Use these opioid pills in the first 24 hours after surgery if you have breakthrough pain. Do not take more than 1 pill every 4-6 hours.  If you still have uncontrolled pain after using all opioid pills, don't hesitate to call our staff using the number provided. We will help make sure you are managing your pain in the best way possible, and if necessary, we can provide a prescription for additional pain medication.   Day 1    Time  Name of Medication Number of pills taken  Amount of Acetaminophen  Pain Level   Comments  AM PM       AM PM       AM PM       AM PM       AM PM       AM PM       AM PM       AM PM       Total Daily amount of Acetaminophen Do not  take more than  3,000 mg per day      Day 2    Time  Name of Medication Number of pills taken  Amount of Acetaminophen  Pain Level   Comments  AM PM       AM PM       AM PM       AM PM       AM PM       AM PM       AM PM       AM PM       Total Daily amount of Acetaminophen Do not take more than  3,000 mg per day      Day 3    Time  Name of Medication Number of pills taken  Amount of Acetaminophen  Pain Level   Comments  AM PM       AM PM       AM PM       AM PM          AM PM       AM PM       AM PM       AM PM       Total Daily amount of Acetaminophen Do not take more than  3,000 mg per day      Day 4    Time  Name of Medication Number of pills taken  Amount of Acetaminophen  Pain Level   Comments  AM PM       AM PM       AM PM       AM PM       AM PM       AM PM       AM PM       AM PM       Total Daily amount of Acetaminophen Do not take more than  3,000 mg per day      Day 5    Time  Name of Medication Number of pills taken  Amount of Acetaminophen  Pain Level   Comments  AM PM       AM PM       AM   PM       AM PM       AM PM       AM PM       AM PM       AM PM       Total Daily amount of Acetaminophen Do not take more than  3,000 mg per day       Day 6    Time  Name of Medication Number of pills taken  Amount of Acetaminophen  Pain Level  Comments  AM PM       AM PM       AM PM       AM PM       AM PM       AM PM       AM PM       AM PM       Total Daily amount of Acetaminophen Do not take more than  3,000 mg per day      Day 7    Time  Name of Medication Number of pills taken  Amount of Acetaminophen  Pain Level   Comments  AM PM       AM PM       AM PM       AM PM       AM PM       AM PM       AM PM       AM PM       Total Daily amount of Acetaminophen Do not take more than  3,000 mg per day        For additional information about how and where to safely dispose of unused  opioid medications - https://www.morepowerfulnc.org  Disclaimer: This document contains information and/or instructional materials adapted from [facility] for the typical patient with your condition. It does not replace medical advice from your health care provider because your experience may differ from that of the typical patient. Talk to your health care provider if you have any questions about this document, your condition or your treatment plan. Adapted from [facility]   

## 2020-10-25 NOTE — H&P (Signed)
Kristy Martin [facility] Sep 15, 1990  [id number].    Chief Complaint/Reason for Consult: cholecystitis  HPI:  This is a pleasant healthy 30 yo Hispanic female who began having some epigastric abdominal pain after eating 3 weeks ago.  She presented to the ED where she had an [place] that was negative for stones or other findings.  She had her pain controlled and was DC home.  She had been doing well until last night after eating dinner and some spicy cheetos when she developed epigastric abdominal pain again along with N/V.  She denies fevers, diarrhea, CP, SOB, etc.  Her pain radiates some to the RUQ.  She presented back to the ED where she has normal LFTs and a normal WBC, but an [place] that reveals a large stone lodged in the gallbladder neck with some dilatation of her CBD to 77mm but no obvious choledocholithiasis noted.  We have been asked to see her for evaluation.  ROS: ROS: Please see HPI, otherwise all other systems reviewed and are negative.  History reviewed. No pertinent family history.  Past Medical History:  Diagnosis Date  . Asthma   . Gallstones   . No pertinent past medical history     Past Surgical History:  Procedure Laterality Date  . NO PAST SURGERIES      Social History:  reports that she has never smoked. She has never used smokeless tobacco. She reports that she does not drink alcohol and does not use drugs.  Allergies: No Known Allergies  (Not in a hospital admission)    Physical Exam: Blood pressure 113/70, pulse 62, temperature 98.4 F (36.9 C), temperature source Oral, resp. rate 18, height 5\' 1"  (1.549 m), weight 76.2 kg, last menstrual period 10/22/2020, SpO2 98 %, currently breastfeeding. General: pleasant, WD, WN Hispanic female who is laying in bed in NAD HEENT: head is normocephalic, atraumatic.  Sclera are noninjected.  PERRL.  Ears and nose without any masses or lesions.  Mouth is pink and moist Heart: regular, rate, and rhythm.  Normal s1,s2. No  obvious murmurs, gallops, or rubs noted.  Palpable radial and pedal pulses bilaterally Lungs: CTAB, no wheezes, rhonchi, or rales noted.  Respiratory effort nonlabored Abd: soft, tender in epigastrium and minimal in RUQ (but just had some pain meds), ND, +BS, no masses, hernias, or organomegaly MS: all 4 extremities are symmetrical with no cyanosis, clubbing, or edema. Skin: warm and dry with no masses, lesions, or rashes Neuro: Cranial nerves 2-12 grossly intact, sensation is normal throughout Psych: A&Ox3 with an appropriate affect.   Results for orders placed or performed during the hospital encounter of 10/25/20 (from the past 48 hour(s))  Urinalysis, Routine w reflex microscopic Urine, Clean Catch     Status: Abnormal   Collection Time: 10/25/20  3:36 AM  Result Value Ref Range   Color, Urine YELLOW YELLOW   APPearance CLOUDY (A) CLEAR   Specific Gravity, Urine 1.029 1.005 - 1.030   pH 5.0 5.0 - 8.0   Glucose, UA NEGATIVE NEGATIVE mg/dL   Hgb urine dipstick MODERATE (A) NEGATIVE   Bilirubin Urine NEGATIVE NEGATIVE   Ketones, ur NEGATIVE NEGATIVE mg/dL   Protein, ur NEGATIVE NEGATIVE mg/dL   Nitrite NEGATIVE NEGATIVE   Leukocytes,Ua LARGE (A) NEGATIVE   RBC / HPF 11-20 0 - 5 RBC/hpf   WBC, UA >50 (H) 0 - 5 WBC/hpf   Bacteria, UA FEW (A) NONE SEEN   Squamous Epithelial / LPF 11-20 0 - 5  Mucus PRESENT    Hyaline Casts, UA PRESENT     Comment: Performed at [facility], [address], [place], [place] [address]  Lipase, blood     Status: None   Collection Time: 10/25/20  3:47 AM  Result Value Ref Range   Lipase 26 11 - 51 U/L    Comment: Performed at [facility], [address] [place], [place] [address]  Comprehensive metabolic panel     Status: Abnormal   Collection Time: 10/25/20  3:47 AM  Result Value Ref Range   Sodium 136 135 - 145 mmol/L   Potassium 4.0 3.5 - 5.1 mmol/L   Chloride 105 98 - 111 mmol/L   CO2 24 22 - 32 mmol/L   Glucose, Bld 116 (H) 70  - 99 mg/dL    Comment: Glucose reference range applies only to samples taken after fasting for at least 8 hours.   BUN 14 6 - 20 mg/dL   Creatinine, Ser 5.36 0.44 - 1.00 mg/dL   Calcium 9.1 8.9 - 14.4 mg/dL   Total Protein 7.1 6.5 - 8.1 g/dL   Albumin 3.8 3.5 - 5.0 g/dL   AST 25 15 - 41 U/L   ALT 24 0 - 44 U/L   Alkaline Phosphatase 79 38 - 126 U/L   Total Bilirubin 0.4 0.3 - 1.2 mg/dL   GFR, Estimated >31 >54 mL/min    Comment: (NOTE) Calculated using the CKD-EPI Creatinine Equation (2021)    Anion gap 7 5 - 15    Comment: Performed at [facility], [address] [place], [place] [address]  CBC     Status: None   Collection Time: 10/25/20  3:47 AM  Result Value Ref Range   WBC 9.6 4.0 - 10.5 K/uL   RBC 4.71 3.87 - 5.11 MIL/uL   Hemoglobin 13.7 12.0 - 15.0 g/dL   HCT 61.9 50.9 - 32.6 %   MCV 90.2 80.0 - 100.0 fL   MCH 29.1 26.0 - 34.0 pg   MCHC 32.2 30.0 - 36.0 g/dL   RDW 71.2 45.8 - 09.9 %   Platelets 333 150 - 400 K/uL   nRBC 0.0 0.0 - 0.2 %    Comment: Performed at [facility], [address] [place], [place] [address]  I-Stat beta hCG blood, ED     Status: None   Collection Time: 10/25/20  5:01 AM  Result Value Ref Range   I-stat hCG, quantitative <5.0 <5 mIU/mL   Comment 3            Comment:   GEST. AGE      CONC.  (mIU/mL)   <=1 WEEK        5 - 50     2 WEEKS       50 - 500     3 WEEKS       100 - 10,000     4 WEEKS     1,000 - 30,000        FEMALE AND NON-PREGNANT FEMALE:     LESS THAN 5 mIU/mL    US Abdomen Limited  Result Date: 10/25/2020 CLINICAL DATA:  Right upper quadrant abdominal pain EXAM: ULTRASOUND ABDOMEN LIMITED RIGHT UPPER QUADRANT COMPARISON:  None. FINDINGS: Gallbladder: 1.3 cm gallstone in the gallbladder neck. Associated gallbladder sludge. Very mild gallbladder wall thickening without pericholecystic fluid or gallbladder distension. Negative sonographic Murphy's sign. Common bile duct: Diameter: 10 mm Liver: Hyperechoic hepatic  parenchyma, suggesting hepatic steatosis, focal fatty sparing along the gallbladder fossa. portal vein is patent on color Doppler imaging with normal direction of blood flow towards the liver. Other: None. IMPRESSION: Cholelithiasis, without associated sonographic findings to suggest acute cholecystitis. Dilated common duct, measuring 10 mm. Correlate with LFTs; if abnormal, consider ERCP or MRCP to exclude choledocholithiasis. Suspected hepatic steatosis with focal fatty sparing. Electronically Signed   By: Charline Bills M.D.   On: 10/25/2020 06:36      Assessment/Plan Early cholecystitis The patient appears to have a stone lodged in the neck of the gallbladder.  We will plan to take her to the OR today for a lap chole with possible IOC given dilated CBD; however, her LFTs are normal.  If this goes well, we discussed being DC home from PACU.  I have explained the procedure, risks, and aftercare of cholecystectomy.  Risks include but are not limited to bleeding, infection, wound problems, anesthesia, diarrhea, bile leak, injury to common bile duct/liver/intestine.  She seems to understand and agrees to proceed.  FEN - NPO VTE - none currently ID - [email or web address] at 7:00am this morning Admit - should DC home from PACU  Letha Cape, [facility] 10/25/2020, 9:42 AM Please see Amion for pager number during day hours 7:00am-4:30pm or 7:00am -11:30am on weekends

## 2020-10-25 NOTE — ED Triage Notes (Signed)
The pt is c/o abd pain since yesterday nausea no vomiting   lmp 3 days ago

## 2020-10-26 ENCOUNTER — Encounter (HOSPITAL_COMMUNITY): Payer: Self-pay | Admitting: Surgery

## 2020-10-26 LAB — URINE CULTURE

## 2020-10-27 LAB — SURGICAL PATHOLOGY

## 2021-12-31 IMAGING — US US ABDOMEN LIMITED
1 series · 14 of 25 positions shown · non-contrast
Comparison: None.

CLINICAL DATA: Right upper quadrant abdominal pain

EXAM:
ULTRASOUND ABDOMEN LIMITED RIGHT UPPER QUADRANT

[Series 1: us abdomen limited · 14 of 82 slices shown]
[im 1/82]
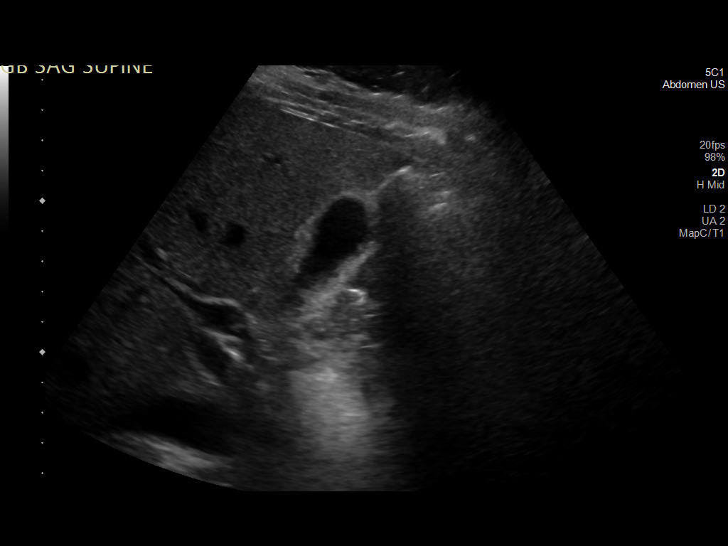
[im 7/82]
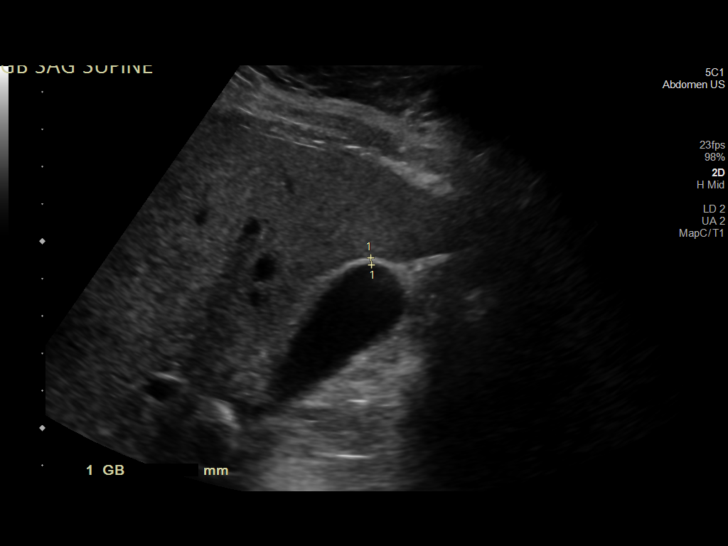
[im 14/82]
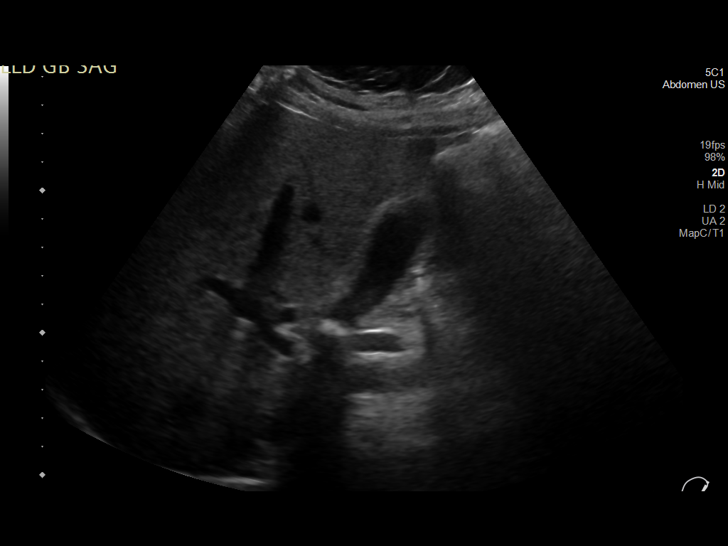
[im 21/82]
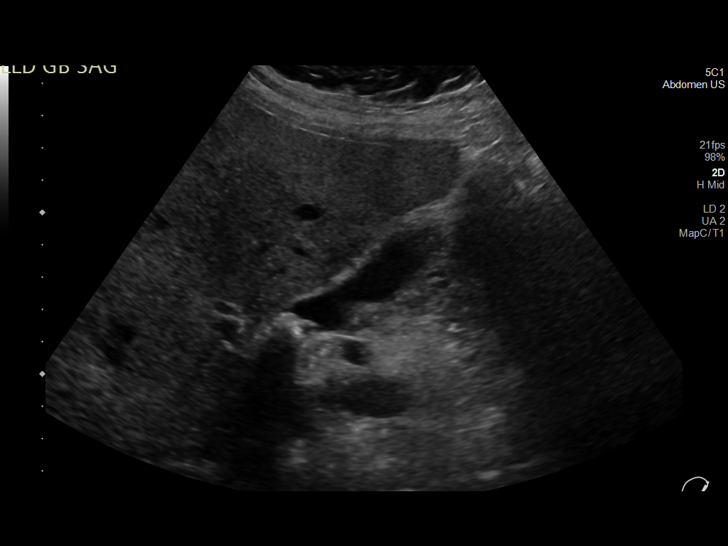
[im 28/82]
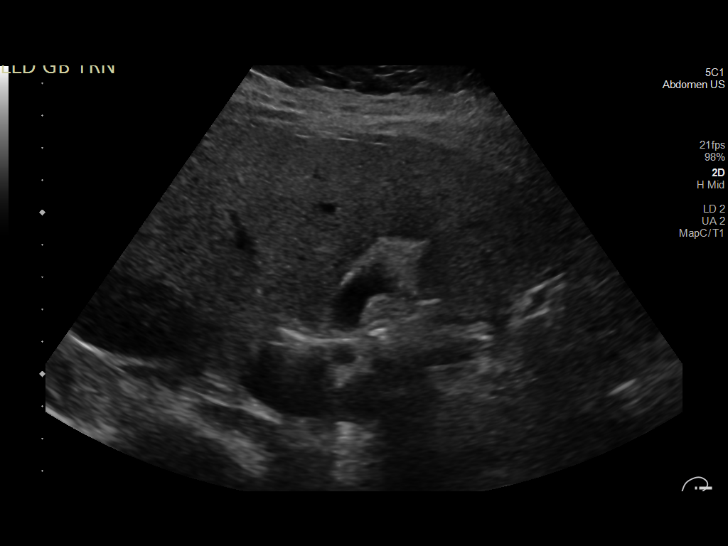
[im 31/82]
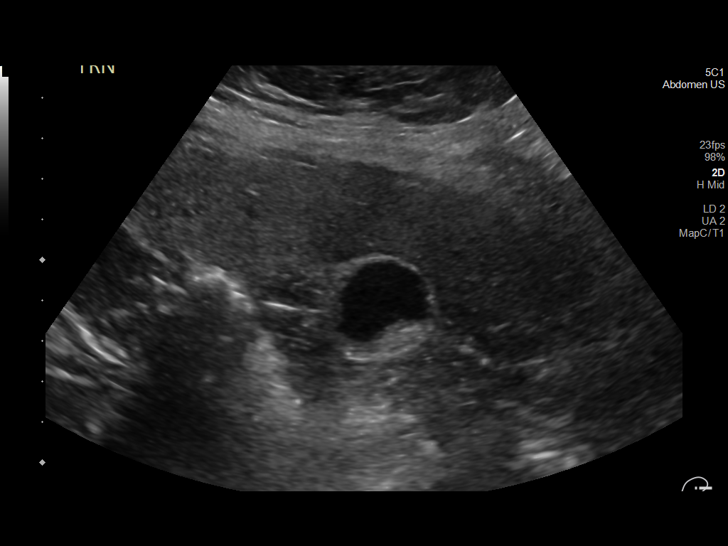
[im 38/82]
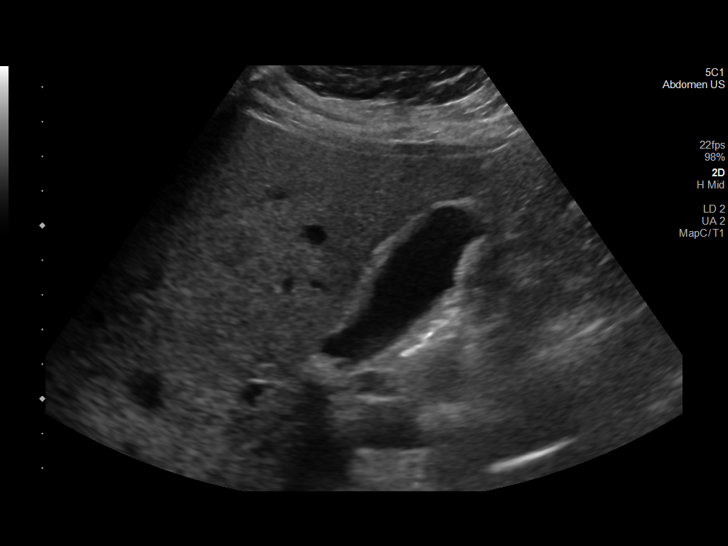
[im 44/82]
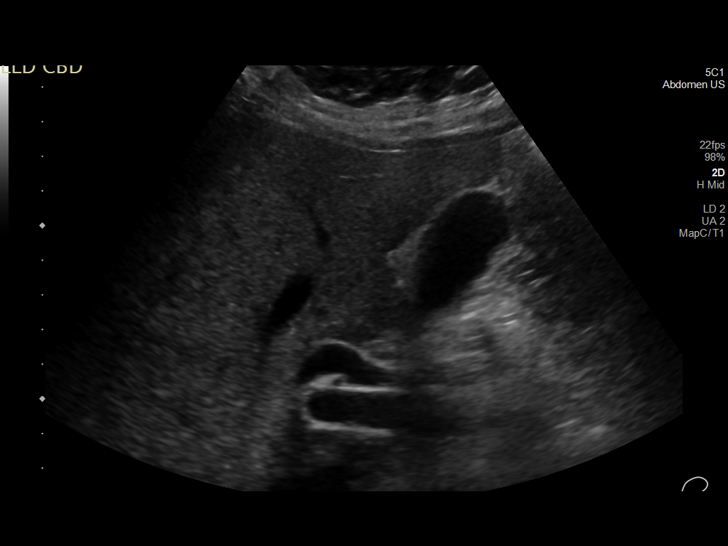
[im 51/82]
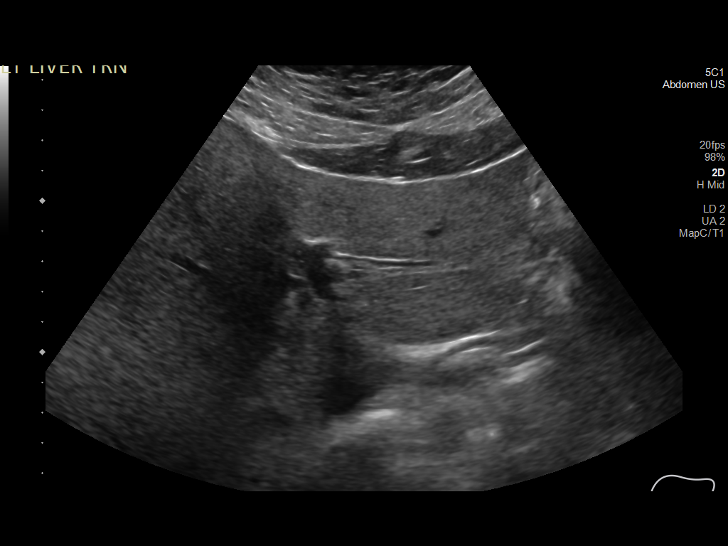
[im 55/82]
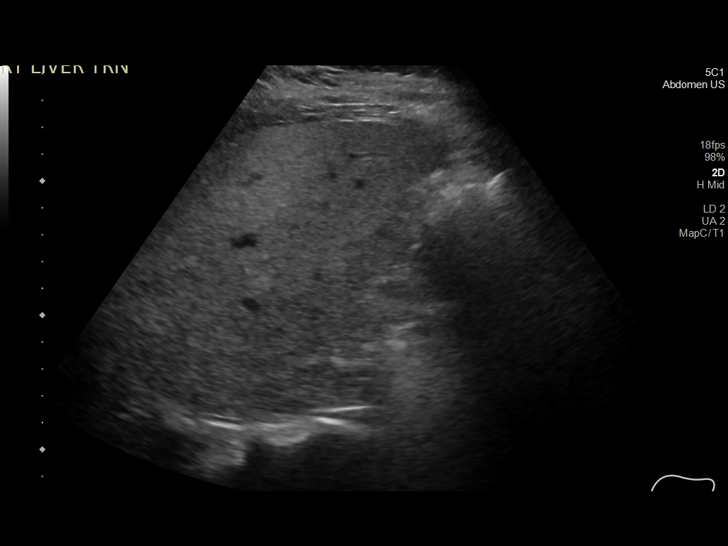
[im 61/82]
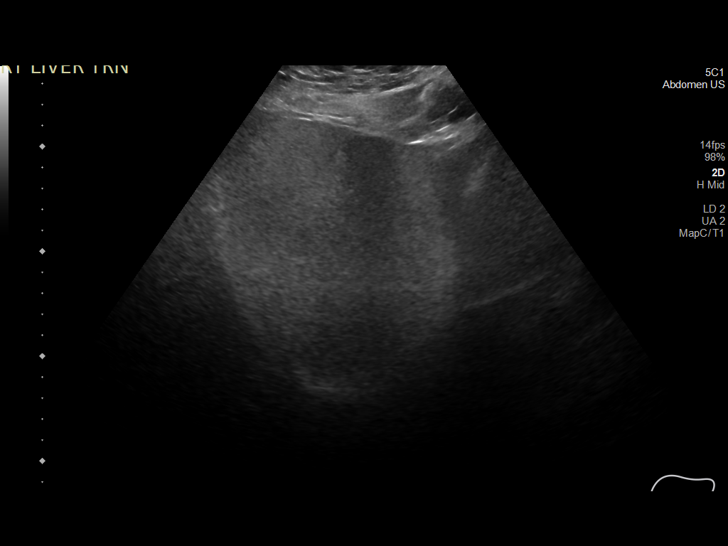
[im 68/82]
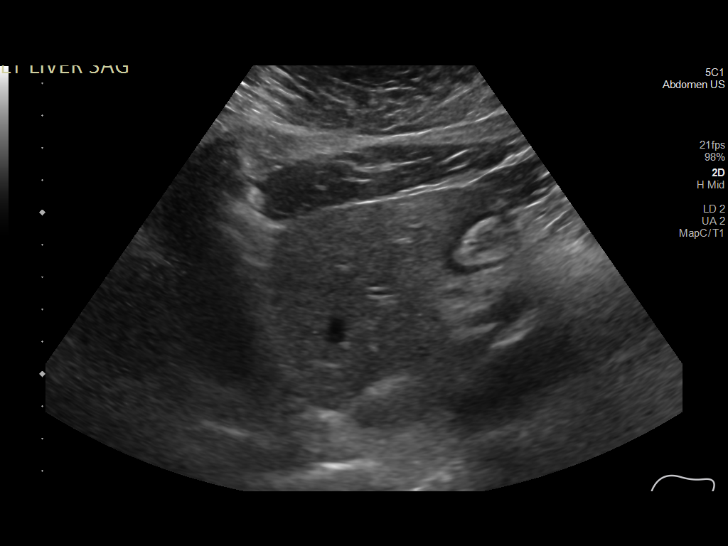
[im 75/82]
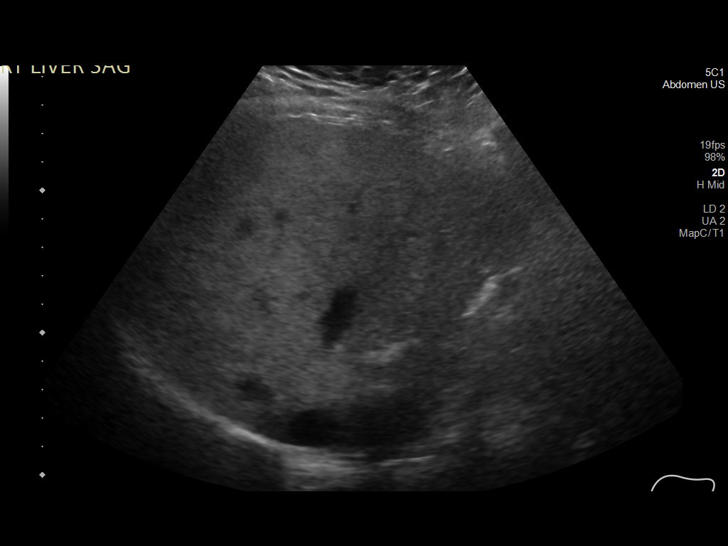
[im 82/82]
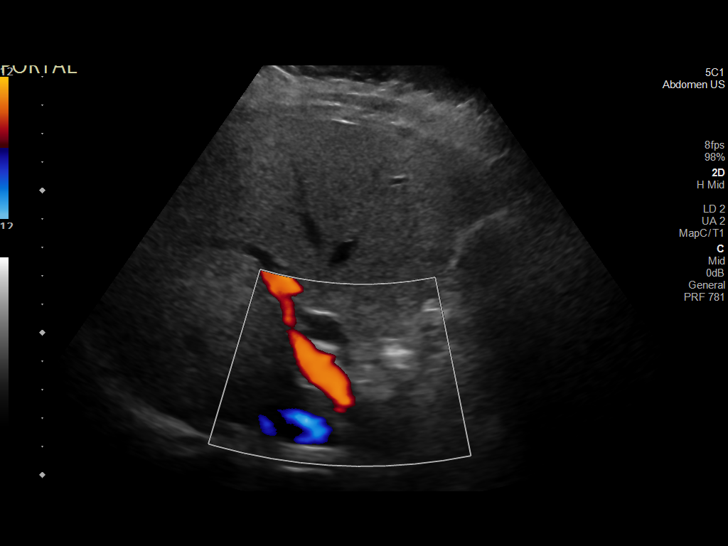

[14 of 25 positions shown; findings below may reference images not displayed]

FINDINGS: Gallbladder:

1.3 cm gallstone in the gallbladder neck. Associated gallbladder
sludge. Very mild gallbladder wall thickening without
pericholecystic fluid or gallbladder distension. Negative
sonographic Murphy's sign.

Common bile duct:

Diameter: 10 mm

Liver:

Hyperechoic hepatic parenchyma, suggesting hepatic steatosis, focal
fatty sparing along the gallbladder fossa. portal vein is patent on
color Doppler imaging with normal direction of blood flow towards
the liver.

Other: None.
IMPRESSION: Cholelithiasis, without associated sonographic findings to suggest
acute cholecystitis.

Dilated common duct, measuring 10 mm. Correlate with LFTs; if
abnormal, consider ERCP or MRCP to exclude choledocholithiasis.

Suspected hepatic steatosis with focal fatty sparing.

## 2023-01-12 NOTE — Progress Notes (Deleted)
   HPI: Ms.Kristy Martin is a 32 y.o. female, who is here today to establish care.  Former PCP: *** Last preventive routine visit: ***  Chronic medical problems: ***  Concerns today: ***  Review of Systems See other pertinent positives and negatives in HPI.  Current Outpatient Medications on File Prior to Visit  Medication Sig Dispense Refill   ibuprofen (ADVIL) 200 MG tablet Take 400 mg by mouth every 6 (six) hours as needed for headache or moderate pain.     ondansetron (ZOFRAN ODT) 4 MG disintegrating tablet Take 1 tablet (4 mg total) by mouth every 8 (eight) hours as needed for nausea or vomiting. (Patient not taking: Reported on 10/25/2020) 10 tablet 0   No current facility-administered medications on file prior to visit.    Past Medical History:  Diagnosis Date   Asthma    Gallstones    No pertinent past medical history    No Known Allergies  No family history on file.  Social History   Socioeconomic History   Marital status: Single    Spouse name: Not on file   Number of children: Not on file   Years of education: Not on file   Highest education level: Not on file  Occupational History   Not on file  Tobacco Use   Smoking status: Never   Smokeless tobacco: Never  Substance and Sexual Activity   Alcohol use: No   Drug use: No   Sexual activity: Yes    Birth control/protection: None  Other Topics Concern   Not on file  Social History Narrative   Not on file   Social Determinants of Health   Financial Resource Strain: Not on file  Food Insecurity: Not on file  Transportation Needs: Not on file  Physical Activity: Not on file  Stress: Not on file  Social Connections: Not on file    There were no vitals filed for this visit.  There is no height or weight on file to calculate BMI.  Physical Exam  ASSESSMENT AND PLAN: There are no diagnoses linked to this encounter.  There are no diagnoses linked to this encounter.  No follow-ups on  file.  Betty G. [place], MD  [facility]. [place] office.

## 2023-01-16 ENCOUNTER — Ambulatory Visit: Payer: Self-pay | Admitting: Family Medicine

## 2023-11-20 ENCOUNTER — Emergency Department (HOSPITAL_COMMUNITY)
Admission: EM | Admit: 2023-11-20 | Discharge: 2023-11-20 | Disposition: A | Discharge status: Home / Self Care | Payer: Self-pay | Attending: Emergency Medicine | Admitting: Emergency Medicine

## 2023-11-20 ENCOUNTER — Emergency Department (HOSPITAL_COMMUNITY): Payer: Self-pay

## 2023-11-20 ENCOUNTER — Other Ambulatory Visit: Payer: Self-pay

## 2023-11-20 ENCOUNTER — Encounter (HOSPITAL_COMMUNITY): Payer: Self-pay

## 2023-11-20 DIAGNOSIS — J181 Lobar pneumonia, unspecified organism: Secondary | ICD-10-CM | POA: Insufficient documentation

## 2023-11-20 DIAGNOSIS — J189 Pneumonia, unspecified organism: Secondary | ICD-10-CM

## 2023-11-20 DIAGNOSIS — J45909 Unspecified asthma, uncomplicated: Secondary | ICD-10-CM | POA: Insufficient documentation

## 2023-11-20 DIAGNOSIS — D72829 Elevated white blood cell count, unspecified: Secondary | ICD-10-CM | POA: Insufficient documentation

## 2023-11-20 LAB — CBC WITH DIFFERENTIAL/PLATELET
Abs Immature Granulocytes: 0.06 10*3/uL (ref 0.00–0.07)
Basophils Absolute: 0.1 10*3/uL (ref 0.0–0.1)
Basophils Relative: 0 %
Eosinophils Absolute: 0.1 10*3/uL (ref 0.0–0.5)
Eosinophils Relative: 1 %
HCT: 42.6 % (ref 36.0–46.0)
Hemoglobin: 13.7 g/dL (ref 12.0–15.0)
Immature Granulocytes: 1 %
Lymphocytes Relative: 20 %
Lymphs Abs: 2.6 10*3/uL (ref 0.7–4.0)
MCH: 28.8 pg (ref 26.0–34.0)
MCHC: 32.2 g/dL (ref 30.0–36.0)
MCV: 89.7 fL (ref 80.0–100.0)
Monocytes Absolute: 0.9 10*3/uL (ref 0.1–1.0)
Monocytes Relative: 7 %
Neutro Abs: 9.5 10*3/uL — ABNORMAL HIGH (ref 1.7–7.7)
Neutrophils Relative %: 71 %
Platelets: 453 10*3/uL — ABNORMAL HIGH (ref 150–400)
RBC: 4.75 MIL/uL (ref 3.87–5.11)
RDW: 13 % (ref 11.5–15.5)
WBC: 13.2 10*3/uL — ABNORMAL HIGH (ref 4.0–10.5)
nRBC: 0 % (ref 0.0–0.2)

## 2023-11-20 LAB — BASIC METABOLIC PANEL WITH GFR
Anion gap: 12 (ref 5–15)
BUN: 9 mg/dL (ref 6–20)
CO2: 22 mmol/L (ref 22–32)
Calcium: 9 mg/dL (ref 8.9–10.3)
Chloride: 103 mmol/L (ref 98–111)
Creatinine, Ser: 0.79 mg/dL (ref 0.44–1.00)
GFR, Estimated: >60 mL/min (ref >60)
Glucose, Bld: 100 mg/dL — ABNORMAL HIGH (ref 70–99)
Potassium: 3.8 mmol/L (ref 3.5–5.1)
Sodium: 137 mmol/L (ref 135–145)

## 2023-11-20 LAB — RESP PANEL BY RT-PCR (RSV, FLU A&B, COVID)  RVPGX2
Influenza A by PCR: NEGATIVE
Influenza B by PCR: NEGATIVE
Resp Syncytial Virus by PCR: NEGATIVE
SARS Coronavirus 2 by RT PCR: NEGATIVE

## 2023-11-20 LAB — HCG, SERUM, QUALITATIVE: Preg, Serum: NEGATIVE

## 2023-11-20 MED ORDER — DOXYCYCLINE HYCLATE 100 MG PO CAPS: 100.0000 mg | ORAL_CAPSULE | Freq: Two times a day (BID) | ORAL | 0 refills | Status: DC

## 2023-11-20 NOTE — Discharge Instructions (Addendum)
 As we discussed, your x-ray showed signs of pneumonia.  We have prescribed you antibiotics for you to fill and take as prescribed in its entirety for management of your symptoms.  You may also take Tylenol /ibuprofen  as needed for pain and fevers.  I recommend having a repeat chest x-ray in the next few weeks to ensure that everything has cleared up.  I have given you a referral to the wellness center that you can call to schedule an appointment to have this done.  You could also go through a PCP if you are able to establish care with 1.  Return if development of any new or worsening symptoms.

## 2023-11-20 NOTE — ED Provider Triage Note (Signed)
 Emergency Medicine Provider Triage Evaluation Note  Kristy Martin , a 33 y.o. female  was evaluated in triage.  Pt complains of complaining of 1 week of headache body aches cough tingling in her hands.  Review of Systems  Positive: Body aches Negative: Fever  Physical Exam  BP 117/68 (BP Location: Right Arm)   Pulse 86   Temp 98.5 F (36.9 C)   Resp 16   Ht 5' 1 (1.549 m)   Wt 75.8 kg   SpO2 96%   BMI 31.55 kg/m  Gen:   Awake, no distress   Resp:  Normal effort  MSK:   Moves extremities without difficulty  Other:    Medical Decision Making  Medically screening exam initiated at 9:59 AM.  Appropriate orders placed.  Kristy Martin was informed that the remainder of the evaluation will be completed by another provider, this initial triage assessment does not replace that evaluation, and the importance of remaining in the ED until their evaluation is complete.     Kristy Ozell BROCKS, MD 11/20/23 [phone]

## 2023-11-20 NOTE — ED Provider Notes (Signed)
 [place] EMERGENCY DEPARTMENT AT [facility] Provider Note   CSN: [id number] Arrival date & time: 11/20/23  9061     Patient presents with: Generalized Body Aches   Kristy Martin is a 33 y.o. female.   Patient with history of asthma presents today with complaints of generalized bodyaches. Notes symptoms x 1 week. Does endorse sick contacts with family members with cold symptoms. Denies fevers or chills. Does endorse intermittent headaches. No vision changes. Some nausea without vomiting. Denies chest pain or shortness of breath. No recent travel, recent surgery, leg pain, or leg swelling. Has been taking ibuprofen  with improvement. She is not on OCPs. No history of similar pain previously.  The history is provided by the patient. No language interpreter was used.       Prior to Admission medications   Medication Sig Start Date End Date Taking? Authorizing Provider  ibuprofen  (ADVIL ) 200 MG tablet Take 400 mg by mouth every 6 (six) hours as needed for headache or moderate pain.    [provider]  ondansetron  (ZOFRAN  ODT) 4 MG disintegrating tablet Take 1 tablet (4 mg total) by mouth every 8 (eight) hours as needed for nausea or vomiting. Patient not taking: Reported on 10/25/2020 10/08/20   Petrucelli, Samantha R, PA-C    Allergies: Patient has no known allergies.    Review of Systems  Neurological:  Positive for headaches.  All other systems reviewed and are negative.   Updated Vital Signs BP 114/75   Pulse 85   Temp 98.8 F (37.1 C)   Resp 16   Ht 5' 1 (1.549 m)   Wt 75.8 kg   SpO2 95%   BMI 31.55 kg/m   Physical Exam Vitals and nursing note reviewed.  Constitutional:      General: She is not in acute distress.    Appearance: Normal appearance. She is normal weight. She is not ill-appearing, toxic-appearing or diaphoretic.  HENT:     Head: Normocephalic and atraumatic.   Eyes:     Extraocular Movements: Extraocular movements  intact.     Pupils: Pupils are equal, round, and reactive to light.   Neck:     Comments: No meningismus Cardiovascular:     Rate and Rhythm: Normal rate and regular rhythm.     Heart sounds: Normal heart sounds.  Pulmonary:     Effort: Pulmonary effort is normal. No respiratory distress.     Breath sounds: Normal breath sounds.  Abdominal:     General: Abdomen is flat.     Palpations: Abdomen is soft.     Tenderness: There is no abdominal tenderness.   Musculoskeletal:        General: No tenderness. Normal range of motion.     Cervical back: Normal range of motion and neck supple.     Right lower leg: No edema.     Left lower leg: No edema.   Skin:    General: Skin is warm and dry.   Neurological:     General: No focal deficit present.     Mental Status: She is alert and oriented to person, place, and time.     GCS: GCS eye subscore is 4. GCS verbal subscore is 5. GCS motor subscore is 6.     Cranial Nerves: Cranial nerves 2-12 are intact. No cranial nerve deficit.     Motor: No weakness.     Coordination: Coordination is intact.     Gait: Gait normal.   Psychiatric:  Mood and Affect: Mood normal.        Behavior: Behavior normal.     (all labs ordered are listed, but only abnormal results are displayed) Labs Reviewed  BASIC METABOLIC PANEL WITH GFR - Abnormal; Notable for the following components:      Result Value   Glucose, Bld 100 (*)    All other components within normal limits  CBC WITH DIFFERENTIAL/PLATELET - Abnormal; Notable for the following components:   WBC 13.2 (*)    Platelets 453 (*)    Neutro Abs 9.5 (*)    All other components within normal limits  RESP PANEL BY RT-PCR (RSV, FLU A&B, COVID)  RVPGX2  HCG, SERUM, QUALITATIVE    EKG: EKG Interpretation Date/Time:  Tuesday November 20 2023 09:48:01 EDT Ventricular Rate:  90 PR Interval:  178 QRS Duration:  100 QT Interval:  350 QTC Calculation: 428 R Axis:   88  Text  Interpretation: Normal sinus rhythm Normal ECG No previous ECGs available Confirmed by Towana Sharper [phone]) on 11/20/2023 9:51:08 AM  Radiology: [facility] Chest 2 View Result Date: 11/20/2023 CLINICAL DATA:  Headache.  Shortness of breath.  History of asthma. EXAM: CHEST - 2 VIEW COMPARISON:  03/01/2010 FINDINGS: Cholecystectomy. Midline trachea. Normal heart size. No pleural effusion or pneumothorax. Patchy bilateral airspace opacities, most confluent in the right greater than left upper lobes. IMPRESSION: Upper lobe predominant multifocal airspace disease, most consistent with pneumonia. Electronically Signed   By: Rockey Kilts M.D.   On: 11/20/2023 11:52     Procedures   Medications Ordered in the ED - No data to display                                  Medical Decision Making  This patient is a 33 y.o. female who presents to the ED for concern of generalized bodyaches, this involves an extensive number of treatment options, and is a complaint that carries with it a high risk of complications and morbidity. The emergent differential diagnosis prior to evaluation includes, but is not limited to,  URI, pneumonia, electrolyte derangement . This is not an exhaustive differential.   Past Medical History / Co-morbidities / Social History:  has a past medical history of Asthma, Gallstones, and No pertinent past medical history.  Additional history: Chart reviewed  Physical Exam: Physical exam performed. The pertinent findings include: Well-appearing, alert and oriented and neurologically intact without focal deficits.  Speaking in complete sentences.  Lung sounds clear to auscultation.  No leg pain or leg swelling.  Lab Tests: I ordered, and personally interpreted labs.  The pertinent results include:  WBC 13.2. RVP negative. No other acute laboratory abnormalities   Imaging Studies: I ordered imaging studies including CXR. I independently visualized and interpreted imaging which showed    Upper lobe predominant multifocal airspace disease, most consistent with pneumonia.  I agree with the radiologist interpretation.   Cardiac Monitoring:  The patient was maintained on a cardiac monitor.  Cardiac monitor showed an underlying rhythm of: sinus rhythm, no STEMI. I agree with this interpretation.   Disposition: After consideration of the diagnostic results and the patients response to treatment, I feel that emergency department workup does not suggest an emergent condition requiring admission or immediate intervention beyond what has been performed at this time. The plan is: Discharge with doxycycline for pneumonia given x-ray findings, close outpatient follow-up and return precautions.  Patient denies having  a PCP, will give referral to the wellness center.  Emphasized importance of having a repeat x-ray in the next few weeks when her symptoms have resolved.  Patient has no signs or symptoms to suggest PE, she is PERC negative.  Close return precautions given. Evaluation and diagnostic testing in the emergency department does not suggest an emergent condition requiring admission or immediate intervention beyond what has been performed at this time.  Plan for discharge with close PCP follow-up.  Patient is understanding and amenable with plan, educated on red flag symptoms that would prompt immediate return.  Patient discharged in stable condition.  Findings and plan of care discussed with supervising physician Dr. Franklyn who is in agreement.   Final diagnoses:  Bilateral upper lobe community acquired pneumonia    ED Discharge Orders          Ordered    doxycycline (VIBRAMYCIN) 100 MG capsule  2 times daily        11/20/23 1819          An After Visit Summary was printed and given to the patient.      Nora Lauraine LABOR, PA-C 11/20/23 1821    Franklyn Sid SAILOR, MD 11/20/23 2141

## 2023-11-20 NOTE — ED Triage Notes (Signed)
 Patient reports generalized bodyaches that come and go especially when she is lying down her feet her and her hands are tingling.  Also reports he head gets really hot.  Denies cough but reports sob and urinary frequency.

## 2024-01-11 ENCOUNTER — Ambulatory Visit: Payer: Self-pay

## 2024-01-11 ENCOUNTER — Encounter (HOSPITAL_COMMUNITY): Payer: Self-pay

## 2024-01-11 ENCOUNTER — Ambulatory Visit (HOSPITAL_COMMUNITY)
Admission: EM | Admit: 2024-01-11 | Discharge: 2024-01-11 | Disposition: A | Discharge status: Home / Self Care | Payer: Self-pay | Attending: Emergency Medicine | Admitting: Emergency Medicine

## 2024-01-11 DIAGNOSIS — N939 Abnormal uterine and vaginal bleeding, unspecified: Secondary | ICD-10-CM | POA: Insufficient documentation

## 2024-01-11 DIAGNOSIS — Z3202 Encounter for pregnancy test, result negative: Secondary | ICD-10-CM

## 2024-01-11 HISTORY — DX: Pneumonia, unspecified organism: J18.9

## 2024-01-11 LAB — CBC
HCT: 41.4 % (ref 36.0–46.0)
Hemoglobin: 13.6 g/dL (ref 12.0–15.0)
MCH: 28.9 pg (ref 26.0–34.0)
MCHC: 32.9 g/dL (ref 30.0–36.0)
MCV: 88.1 fL (ref 80.0–100.0)
Platelets: 349 K/uL (ref 150–400)
RBC: 4.7 MIL/uL (ref 3.87–5.11)
RDW: 13.5 % (ref 11.5–15.5)
WBC: 7.8 K/uL (ref 4.0–10.5)
nRBC: 0 % (ref 0.0–0.2)

## 2024-01-11 LAB — POCT URINE PREGNANCY: Preg Test, Ur: NEGATIVE

## 2024-01-11 MED ORDER — MEDROXYPROGESTERONE ACETATE 10 MG PO TABS: 10.0000 mg | ORAL_TABLET | Freq: Every day | ORAL | 0 refills | Status: AC

## 2024-01-11 NOTE — ED Provider Notes (Signed)
 [facility]    CSN: [id number] Arrival date & time: 01/11/24  0913      History   Chief Complaint Chief Complaint  Patient presents with   Vaginal Bleeding    HPI Kristy Martin is a 33 y.o. female.   Patient presents with heavy vaginal bleeding x 7 days.  Patient states that she was almost due to start her monthly menstrual cycle, when she began to have some spotting and then the next day began to have heavy vaginal bleeding with clots that is stayed the same for the last 7 days.  Patient reports that she has had to change a pad out about every 3-4 hours.  Patient does endorse some intermittent abdominal cramping.  Denies fever, body aches, chills, dizziness, weakness, fatigue, and shortness of breath.  Patient states that she is not currently on any birth controls.  Patient states that she does not currently see an OB/GYN, but she did schedule an appointment on 8/18 to be seen by one for this bleeding.  Patient denies any abnormal vaginal discharge, itching, pain, or lesions.  Patient also denies dysuria, hematuria, urinary frequency/urgency, flank pain.  The history is provided by the patient and medical records.  Vaginal Bleeding   Past Medical History:  Diagnosis Date   Asthma    Gallstones    No pertinent past medical history    Pneumonia     Patient Active Problem List   Diagnosis Date Noted   Cholecystitis 10/25/2020   Normal vaginal delivery 12/06/2010    Past Surgical History:  Procedure Laterality Date   CHOLECYSTECTOMY N/A 10/25/2020   Procedure: LAPAROSCOPIC CHOLECYSTECTOMY;  Surgeon: Signe Mitzie LABOR, MD;  Location: [facility] OR;  Service: General;  Laterality: N/A;   NO PAST SURGERIES      OB History     Gravida  1   Para  1   Term  1   Preterm  0   AB  0   Living  1      SAB  0   IAB  0   Ectopic  0   Multiple  0   Live Births  1            Home Medications    Prior to Admission medications   Medication  Sig Start Date End Date Taking? Authorizing Provider  medroxyPROGESTERone  (PROVERA ) 10 MG tablet Take 1 tablet (10 mg total) by mouth daily for 10 days. 01/11/24 01/21/24 Yes Johnie Flaming A, NP  doxycycline  (VIBRAMYCIN ) 100 MG capsule Take 1 capsule (100 mg total) by mouth 2 (two) times daily. Patient not taking: Reported on 01/11/2024 11/20/23   Smoot, Lauraine LABOR, PA-C  ibuprofen  (ADVIL ) 200 MG tablet Take 400 mg by mouth every 6 (six) hours as needed for headache or moderate pain.    [provider]  ondansetron  (ZOFRAN  ODT) 4 MG disintegrating tablet Take 1 tablet (4 mg total) by mouth every 8 (eight) hours as needed for nausea or vomiting. Patient not taking: Reported on 10/25/2020 10/08/20   Petrucelli, Lucie SAUNDERS, PA-C    Family History Family History  Problem Relation Age of Onset   Hypertension Mother     Social History Social History   Tobacco Use   Smoking status: Never   Smokeless tobacco: Never  Vaping Use   Vaping status: Never Used  Substance Use Topics   Alcohol use: No   Drug use: No     Allergies   Patient has no known  allergies.   Review of Systems Review of Systems  Genitourinary:  Positive for vaginal bleeding.   Per HPI  Physical Exam Triage Vital Signs ED Triage Vitals  Encounter Vitals Group     BP 01/11/24 0948 120/83     Girls Systolic BP Percentile --      Girls Diastolic BP Percentile --      Boys Systolic BP Percentile --      Boys Diastolic BP Percentile --      Pulse Rate 01/11/24 0948 63     Resp 01/11/24 0948 16     Temp 01/11/24 0948 98 F (36.7 C)     Temp Source 01/11/24 0948 Oral     SpO2 01/11/24 0948 100 %     Weight --      Height --      Head Circumference --      Peak Flow --      Pain Score 01/11/24 0950 2     Pain Loc --      Pain Education --      Exclude from Growth Chart --    No data found.  Updated Vital Signs BP 120/83 (BP Location: Right Arm)   Pulse 63   Temp 98 F (36.7 C) (Oral)   Resp 16    LMP 01/04/2024 (Exact Date)   SpO2 100%   Visual Acuity Right Eye Distance:   Left Eye Distance:   Bilateral Distance:    Right Eye Near:   Left Eye Near:    Bilateral Near:     Physical Exam Vitals and nursing note reviewed.  Constitutional:      General: She is awake. She is not in acute distress.    Appearance: Normal appearance. She is well-developed and well-groomed. She is not ill-appearing.  Abdominal:     General: Bowel sounds are normal.     Palpations: Abdomen is soft.     Tenderness: There is abdominal tenderness in the right lower quadrant, suprapubic area and left lower quadrant. There is no guarding or rebound.     Comments: Mild generalized lower abdominal tenderness.  Skin:    General: Skin is warm and dry.  Neurological:     Mental Status: She is alert.  Psychiatric:        Behavior: Behavior is cooperative.      UC Treatments / Results  Labs (all labs ordered are listed, but only abnormal results are displayed) Labs Reviewed  POCT URINE PREGNANCY - Normal  CBC    EKG   Radiology No results found.  Procedures Procedures (including critical care time)  Medications Ordered in UC Medications - No data to display  Initial Impression / Assessment and Plan / UC Course  I have reviewed the triage vital signs and the nursing notes.  Pertinent labs & imaging results that were available during my care of the patient were reviewed by me and considered in my medical decision making (see chart for details).     Patient is overall well-appearing.  Vitals are stable.  Mildly tender around the patient to generalized lower abdomen.  No other significant findings on exam.  Urine pregnancy negative.  Ordered CBC to evaluate blood counts related to excessive bleeding.  Started patient on Provera  to help control bleeding.  Discussed follow-up, return, and strict ER precautions. Final Clinical Impressions(s) / UC Diagnoses   Final diagnoses:  Vaginal  bleeding     Discharge Instructions      Start taking Provera   once daily for 10 days to help with bleeding. Your blood counts will come back later today and someone will call if results are concerning and require any additional immediate treatment. Keep your appointment with your OB/GYN that you scheduled on 8/18 for further evaluation and management of this. If you develop worsening abdominal pain, increased bleeding, dizziness, weakness, or shortness of breath please seek immediate medical treatment in the emergency department.   ED Prescriptions     Medication Sig Dispense Auth. Provider   medroxyPROGESTERone  (PROVERA ) 10 MG tablet Take 1 tablet (10 mg total) by mouth daily for 10 days. 10 tablet Johnie Flaming A, NP      PDMP not reviewed this encounter.   Johnie Flaming A, NP 01/11/24 1156

## 2024-01-11 NOTE — ED Notes (Addendum)
 Patient c/o heavy vaginal bleeding with clots x 7 days. Patient states she normally has an irregular period and very little bleeding.

## 2024-01-11 NOTE — Discharge Instructions (Addendum)
 Start taking Provera  once daily for 10 days to help with bleeding. Your blood counts will come back later today and someone will call if results are concerning and require any additional immediate treatment. Keep your appointment with your OB/GYN that you scheduled on 8/18 for further evaluation and management of this. If you develop worsening abdominal pain, increased bleeding, dizziness, weakness, or shortness of breath please seek immediate medical treatment in the emergency department.

## 2024-02-14 ENCOUNTER — Ambulatory Visit (INDEPENDENT_AMBULATORY_CARE_PROVIDER_SITE_OTHER): Payer: Self-pay | Admitting: Family Medicine

## 2024-02-14 ENCOUNTER — Encounter: Payer: Self-pay | Admitting: Family Medicine

## 2024-02-14 ENCOUNTER — Other Ambulatory Visit (HOSPITAL_COMMUNITY)
Admission: RE | Admit: 2024-02-14 | Discharge: 2024-02-14 | Disposition: A | Discharge status: Home / Self Care | Payer: Self-pay | Source: Ambulatory Visit | Attending: Family Medicine | Admitting: Family Medicine

## 2024-02-14 VITALS — BP 120/74 | HR 67 | Ht 61.0 in | Wt 182.4 lb

## 2024-02-14 DIAGNOSIS — N970 Female infertility associated with anovulation: Secondary | ICD-10-CM

## 2024-02-14 DIAGNOSIS — Z124 Encounter for screening for malignant neoplasm of cervix: Secondary | ICD-10-CM

## 2024-02-14 DIAGNOSIS — N6320 Unspecified lump in the left breast, unspecified quadrant: Secondary | ICD-10-CM

## 2024-02-14 MED ORDER — LETROZOLE 2.5 MG PO TABS: 2.5000 mg | ORAL_TABLET | Freq: Every day | ORAL | 2 refills | Status: AC

## 2024-02-14 MED ORDER — PRENATAL VITAMIN 27-0.8 MG PO TABS: 1.0000 | ORAL_TABLET | Freq: Every day | ORAL | 3 refills | Status: AC

## 2024-02-14 NOTE — Patient Instructions (Signed)
 Take Provera  (medroxyprosterone) to start cycle. The day it starts is day #1. Take Femara  (letrozole ) Day 3-7. Have sex day 10-20 at least every other day. On Day 30 take pregnancy test. If negative, restart Provera . If you get a cycle prior to day 30, that is day 1 and take Femara  on day 3-7 again and repeat above. If positive pregnancy test, schedule appointment for OB care.  Please take prenatal vitamin daily.

## 2024-02-14 NOTE — Progress Notes (Addendum)
   Subjective:    Patient ID: Kristy Martin is a 33 y.o. female presenting with New Patient (Initial Visit)  on 02/14/2024  HPI: [id number] Menarche at age 75. Cycles were never regular until after her son and they were regular for 1 year.  She get 2-3 cycles/year and sometimes spotting and sometimes heavy. May have had chlamydia after her son. New partner but same x 10 years. No contraception x 10 years. Partner with no kids. Has h/o dysmenorrhea.   Review of Systems  Constitutional:  Negative for chills and fever.  Respiratory:  Negative for shortness of breath.   Cardiovascular:  Negative for chest pain.  Gastrointestinal:  Negative for abdominal pain, nausea and vomiting.  Genitourinary:  Negative for dysuria.  Skin:  Negative for rash.      Objective:    BP 120/74   Pulse 67   Ht 5' 1 (1.549 m)   Wt 182 lb 6.4 oz (82.7 kg)   LMP 01/04/2024 (Exact Date)   BMI 34.46 kg/m  Physical Exam Exam conducted with a chaperone present.  Constitutional:      General: She is not in acute distress.    Appearance: She is well-developed.  HENT:     Head: Normocephalic and atraumatic.  Eyes:     General: No scleral icterus. Cardiovascular:     Rate and Rhythm: Normal rate.  Pulmonary:     Effort: Pulmonary effort is normal.  Chest:  Breasts:    Right: No inverted nipple or mass.     Left: No inverted nipple or mass.  Abdominal:     Palpations: Abdomen is soft.  Genitourinary:    General: Normal vulva.     Exam position: Lithotomy position.     Vagina: Normal.     Cervix: Normal.     Uterus: Normal.      Adnexa: Right adnexa normal and left adnexa normal.  Musculoskeletal:     Cervical back: Neck supple.  Skin:    General: Skin is warm and dry.  Neurological:     Mental Status: She is alert and oriented to person, place, and time.         Assessment & Plan:   Problem List Items Addressed This Visit       Unprioritized   Infertility associated with  anovulation - Primary   Discussed top 3 reasons for infertility. She is obviously anovulatory. She is self-pay and prefers to defer HSG and semen analysis, though obviously any of these things might be at play. Will check labs and attempt femara  for a few months to start. Begin PNV's.      Relevant Medications   letrozole  (FEMARA ) 2.5 MG tablet   Prenatal Vit-Fe Fumarate-FA (PRENATAL VITAMIN) 27-0.8 MG TABS   Other Relevant Orders   TSH   Hemoglobin A1c   Mass of left breast   Per patient. Could not feel anything on exam today. She could not find it either. If recurs, consider B[facility]CP referral.      Other Visit Diagnoses       Screening for cervical cancer       Relevant Orders   Cytology - PAP( [place])       Return in about 3 months (around 05/15/2024) for virtual.  Glenys GORMAN Birk, MD 02/14/2024 9:38 AM

## 2024-02-14 NOTE — Assessment & Plan Note (Addendum)
 Discussed top 3 reasons for infertility. She is obviously anovulatory. She is self-pay and prefers to defer HSG and semen analysis, though obviously any of these things might be at play. Will check labs and attempt femara  for a few months to start. Begin PNV's.

## 2024-02-14 NOTE — Assessment & Plan Note (Signed)
 Per patient. Could not feel anything on exam today. She could not find it either. If recurs, consider B[facility]CP referral.

## 2024-02-14 NOTE — Progress Notes (Signed)
 Pt presents for infertility. Pt states that she has had unprotected sex for years with no luck. Pt does have a 32 year old. Pt states that she has a lump on left breast.

## 2024-02-15 ENCOUNTER — Ambulatory Visit: Payer: Self-pay | Admitting: Family Medicine

## 2024-02-15 LAB — HEMOGLOBIN A1C
Est. average glucose Bld gHb Est-mCnc: 111 mg/dL
Hgb A1c MFr Bld: 5.5 % (ref 4.8–5.6)

## 2024-02-15 LAB — TSH: TSH: 1.66 u[IU]/mL (ref 0.450–4.500)

## 2024-02-18 LAB — CYTOLOGY - PAP
Comment: NEGATIVE
Diagnosis: NEGATIVE
High risk HPV: NEGATIVE
